# Patient Record
Sex: Female | Born: 1980
Health system: Southern US, Community
[De-identification: ages and names within clinical notes are randomized; demographics above are authoritative.]

## PROBLEM LIST (undated history)

## (undated) ENCOUNTER — Inpatient Hospital Stay (HOSPITAL_COMMUNITY): Payer: Self-pay

## (undated) ENCOUNTER — Inpatient Hospital Stay (HOSPITAL_COMMUNITY): Payer: 59

## (undated) DIAGNOSIS — Z87891 Personal history of nicotine dependence: Secondary | ICD-10-CM

## (undated) DIAGNOSIS — R011 Cardiac murmur, unspecified: Secondary | ICD-10-CM

## (undated) DIAGNOSIS — R519 Headache, unspecified: Secondary | ICD-10-CM

## (undated) DIAGNOSIS — O24419 Gestational diabetes mellitus in pregnancy, unspecified control: Secondary | ICD-10-CM

## (undated) DIAGNOSIS — E049 Nontoxic goiter, unspecified: Secondary | ICD-10-CM

## (undated) DIAGNOSIS — R51 Headache: Secondary | ICD-10-CM

## (undated) DIAGNOSIS — B999 Unspecified infectious disease: Secondary | ICD-10-CM

## (undated) DIAGNOSIS — Z8279 Family history of other congenital malformations, deformations and chromosomal abnormalities: Secondary | ICD-10-CM

## (undated) HISTORY — PX: CARDIAC SURGERY: SHX584

## (undated) HISTORY — DX: Family history of other congenital malformations, deformations and chromosomal abnormalities: Z82.79

## (undated) HISTORY — DX: Nontoxic goiter, unspecified: E04.9

## (undated) HISTORY — DX: Gestational diabetes mellitus in pregnancy, unspecified control: O24.419

---
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
## 2003-01-18 ENCOUNTER — Encounter: Admission: RE | Admit: 2003-01-18 | Discharge: 2003-01-18 | Payer: Self-pay | Admitting: Family Medicine

## 2003-06-24 ENCOUNTER — Encounter: Payer: Self-pay | Admitting: Emergency Medicine

## 2003-06-24 ENCOUNTER — Emergency Department (HOSPITAL_COMMUNITY): Admission: EM | Admit: 2003-06-24 | Discharge: 2003-06-24 | Payer: Self-pay | Admitting: Emergency Medicine

## 2004-12-18 ENCOUNTER — Emergency Department (HOSPITAL_COMMUNITY): Admission: EM | Admit: 2004-12-18 | Discharge: 2004-12-19 | Payer: Self-pay | Admitting: Emergency Medicine

## 2004-12-22 ENCOUNTER — Emergency Department (HOSPITAL_COMMUNITY): Admission: EM | Admit: 2004-12-22 | Discharge: 2004-12-22 | Payer: Self-pay | Admitting: Emergency Medicine

## 2005-03-30 ENCOUNTER — Emergency Department (HOSPITAL_COMMUNITY): Admission: EM | Admit: 2005-03-30 | Discharge: 2005-03-30 | Payer: Self-pay | Admitting: Emergency Medicine

## 2005-09-02 ENCOUNTER — Inpatient Hospital Stay (HOSPITAL_COMMUNITY): Admission: AD | Admit: 2005-09-02 | Discharge: 2005-09-02 | Payer: Self-pay | Admitting: Obstetrics & Gynecology

## 2006-03-12 ENCOUNTER — Emergency Department (HOSPITAL_COMMUNITY): Admission: EM | Admit: 2006-03-12 | Discharge: 2006-03-12 | Payer: Self-pay | Admitting: Emergency Medicine

## 2006-12-11 DIAGNOSIS — N946 Dysmenorrhea, unspecified: Secondary | ICD-10-CM | POA: Insufficient documentation

## 2008-06-03 ENCOUNTER — Emergency Department (HOSPITAL_COMMUNITY): Admission: EM | Admit: 2008-06-03 | Discharge: 2008-06-03 | Payer: Self-pay | Admitting: Emergency Medicine

## 2008-07-28 ENCOUNTER — Ambulatory Visit: Payer: Self-pay | Admitting: Gynecology

## 2012-01-27 ENCOUNTER — Encounter (HOSPITAL_BASED_OUTPATIENT_CLINIC_OR_DEPARTMENT_OTHER): Payer: Self-pay

## 2012-01-27 ENCOUNTER — Emergency Department (HOSPITAL_BASED_OUTPATIENT_CLINIC_OR_DEPARTMENT_OTHER)
Admission: EM | Admit: 2012-01-27 | Discharge: 2012-01-27 | Disposition: A | Discharge status: Home / Self Care | Payer: No Typology Code available for payment source | Attending: Emergency Medicine | Admitting: Emergency Medicine

## 2012-01-27 DIAGNOSIS — S43499A Other sprain of unspecified shoulder joint, initial encounter: Secondary | ICD-10-CM | POA: Insufficient documentation

## 2012-01-27 DIAGNOSIS — T148XXA Other injury of unspecified body region, initial encounter: Secondary | ICD-10-CM

## 2012-01-27 HISTORY — DX: Cardiac murmur, unspecified: R01.1

## 2012-01-27 MED ORDER — TRAMADOL HCL 50 MG PO TABS: 50.0000 mg | ORAL_TABLET | Freq: Four times a day (QID) | ORAL | Status: AC | PRN

## 2012-01-27 MED ORDER — METHOCARBAMOL 500 MG PO TABS: 500.0000 mg | ORAL_TABLET | Freq: Two times a day (BID) | ORAL | Status: AC

## 2012-01-27 MED ORDER — NAPROXEN 500 MG PO TABS: 500.0000 mg | ORAL_TABLET | Freq: Two times a day (BID) | ORAL | Status: AC
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
## 2012-01-27 NOTE — ED Notes (Signed)
PA-C at bedside 
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
## 2012-01-27 NOTE — ED Provider Notes (Signed)
Medical screening examination/treatment/procedure(s) were performed by non-physician practitioner and as supervising physician I was immediately available for consultation/collaboration.   Kielyn Kardell, MD 01/27/12 1200 
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
## 2012-04-20 ENCOUNTER — Emergency Department (HOSPITAL_BASED_OUTPATIENT_CLINIC_OR_DEPARTMENT_OTHER): Payer: 59

## 2012-04-20 ENCOUNTER — Encounter (HOSPITAL_BASED_OUTPATIENT_CLINIC_OR_DEPARTMENT_OTHER): Payer: Self-pay | Admitting: *Deleted

## 2012-04-20 DIAGNOSIS — J40 Bronchitis, not specified as acute or chronic: Secondary | ICD-10-CM | POA: Insufficient documentation

## 2012-04-20 NOTE — ED Notes (Signed)
Patient states she is having SOB since yesterday but worsening tonight. Also having pain in her chest when she coughs. Patient has a moderate nonproductive cough.

## 2012-04-21 ENCOUNTER — Emergency Department (HOSPITAL_BASED_OUTPATIENT_CLINIC_OR_DEPARTMENT_OTHER)
Admission: EM | Admit: 2012-04-21 | Discharge: 2012-04-21 | Disposition: A | Discharge status: Home / Self Care | Payer: 59 | Attending: Emergency Medicine | Admitting: Emergency Medicine

## 2012-04-21 DIAGNOSIS — J40 Bronchitis, not specified as acute or chronic: Secondary | ICD-10-CM

## 2012-04-21 LAB — CBC WITH DIFFERENTIAL/PLATELET
Basophils Absolute: 0 10*3/uL (ref 0.0–0.1)
Eosinophils Absolute: 0.2 10*3/uL (ref 0.0–0.7)
Lymphocytes Relative: 33 % (ref 12–46)
Lymphs Abs: 2.8 10*3/uL (ref 0.7–4.0)
MCH: 29.7 pg (ref 26.0–34.0)
Neutrophils Relative %: 54 % (ref 43–77)
Platelets: 317 10*3/uL (ref 150–400)
RBC: 4.28 MIL/uL (ref 3.87–5.11)
RDW: 13.7 % (ref 11.5–15.5)
WBC: 8.4 10*3/uL (ref 4.0–10.5)

## 2012-04-21 LAB — BASIC METABOLIC PANEL
GFR calc non Af Amer: >90 mL/min (ref >90)
Glucose, Bld: 100 mg/dL — ABNORMAL HIGH (ref 70–99)
Potassium: 3.5 mEq/L (ref 3.5–5.1)
Sodium: 136 mEq/L (ref 135–145)

## 2012-04-21 LAB — TROPONIN I: Troponin I: <0.3 ng/mL (ref <0.30)

## 2012-04-21 LAB — D-DIMER, QUANTITATIVE: D-Dimer, Quant: <0.22 ug/mL-FEU (ref 0.00–0.48)

## 2012-04-21 MED ORDER — BENZONATATE 100 MG PO CAPS: 100.0000 mg | ORAL_CAPSULE | Freq: Three times a day (TID) | ORAL | Status: AC

## 2012-04-21 MED ORDER — TRAMADOL HCL 50 MG PO TABS
50.0000 mg | ORAL_TABLET | Freq: Four times a day (QID) | ORAL | Status: AC | PRN
Start: 2012-04-21 — End: 2012-05-01

## 2012-04-21 MED ORDER — ALBUTEROL SULFATE HFA 108 (90 BASE) MCG/ACT IN AERS: 1.0000 | INHALATION_SPRAY | Freq: Four times a day (QID) | RESPIRATORY_TRACT | Status: DC | PRN

## 2012-04-21 NOTE — ED Provider Notes (Signed)
History  This chart was scribed for Doak Mah Smitty Cords, MD by Ladona Ridgel Day. This patient was seen in room MH03/MH03 and the patient's care was started at 2239  CSN: [id number]  Arrival date & time 04/20/12  2239   First MD Initiated Contact with Patient 04/21/12 0026      Chief Complaint  Patient presents with  . Shortness of Breath    Patient is a 31 y.o. female presenting with cough. The history is provided by the patient. No language interpreter was used.  Cough This is a new problem. The current episode started 2 days ago. The problem occurs constantly. The problem has been gradually worsening. The cough is non-productive. There has been no fever. Associated symptoms include shortness of breath. Pertinent negatives include no chest pain, no chills, no sweats, no headaches, no rhinorrhea, no sore throat and no myalgias. She has tried nothing for the symptoms. The treatment provided no relief. She is a smoker. Her past medical history is significant for asthma.    Cassandra Mann is a 31 y.o. female who presents to the Emergency Department complaining of constant, gradually worsening, non-productive cough since yesterday. She states her associated symptoms are SOB, nasal congestion and mild CP that is worse with coughing. She denies taking any medications for her symptoms. She denies having any sick contacts with similar symptoms. She has a h/o heart murmur. She is a current everyday smoker but denies alcohol use.  Past Medical History  Diagnosis Date  . Heart murmur     Past Surgical History  Procedure Date  . Cardiac surgery     No family history on file.  History  Substance Use Topics  . Smoking status: Current Everyday Smoker  . Smokeless tobacco: Never Used  . Alcohol Use: No    No OB history provided.  Review of Systems  Constitutional: Negative for chills and diaphoresis.  HENT: Positive for congestion. Negative for sore throat, rhinorrhea, sneezing, neck pain and  ear discharge.   Respiratory: Positive for cough and shortness of breath.   Cardiovascular: Negative for chest pain, palpitations and leg swelling.  Gastrointestinal: Negative for abdominal pain and anal bleeding.  Genitourinary: Negative for dysuria and difficulty urinating.  Musculoskeletal: Negative for myalgias.  Skin: Negative for color change and wound.  Neurological: Negative for facial asymmetry and headaches.  All other systems reviewed and are negative.    Allergies  Naproxen  Home Medications   Current Outpatient Rx  Name Route Sig Dispense Refill  . NAPROXEN 500 MG PO TABS Oral Take 1 tablet (500 mg total) by mouth 2 (two) times daily. 30 tablet 0    Triage Vitals: BP 115/75  Pulse 92  Temp 98.9 F (37.2 C) (Oral)  Resp 24  SpO2 100%  LMP 03/22/2012  Physical Exam  Nursing note and vitals reviewed. Constitutional: She is oriented to person, place, and time. She appears well-developed and well-nourished.  HENT:  Head: Normocephalic and atraumatic.       Cobblestone in back of throat  Eyes: Conjunctivae and EOM are normal. Pupils are equal, round, and reactive to light.  Neck: Normal range of motion. Neck supple. No thyromegaly present.  Cardiovascular: Normal rate, regular rhythm and normal heart sounds.  Exam reveals no gallop and no friction rub.   No murmur heard. Pulmonary/Chest: Effort normal and breath sounds normal. No respiratory distress. She has no wheezes. She has no rales.  Abdominal: Soft. Bowel sounds are normal. She exhibits no distension. There is  no tenderness. There is no rebound and no guarding.  Musculoskeletal: She exhibits no edema and no tenderness.  Lymphadenopathy:    She has no cervical adenopathy.  Neurological: She is alert and oriented to person, place, and time. She has normal reflexes. She displays normal reflexes.  Skin: Skin is warm and dry.  Psychiatric: She has a normal mood and affect. Her behavior is normal.    ED  Course  Procedures (including critical care time)  DIAGNOSTIC STUDIES: Oxygen Saturation is 100% on room air, normal by my interpretation.    COORDINATION OF CARE: At 1231 AM Discussed treatment plan with patient which includes CXR, blood work, heart markers and D-dimer. Patient agrees. Discussed smoking cessation and pt agrees.   Labs Reviewed  CBC WITH DIFFERENTIAL  BASIC METABOLIC PANEL  TROPONIN I  PREGNANCY, URINE  D-DIMER, QUANTITATIVE   Dg Chest 2 View  04/20/2012  *RADIOLOGY REPORT*  Clinical Data: Cough  CHEST - 2 VIEW  Comparison: 12/22/2004  Findings: Normal heart size.  Clear lungs.  IMPRESSION: Negative.  Original Report Authenticated By: Donavan Burnet, M.D.     No diagnosis found.  No ACS, this is clearly viral bronchitis.  In setting of > 8 hours of symptoms one negative EKG and troponin is sufficient to exclude ACS. No fever, no indication for antibiotics.  Return for fevers > 101, productive cough, chest pain or any concerns.  Follow up in 2 days with your family doctor for recheck.  Patient verbalizes understanding and agrees to follow up MDM   Date: 04/21/2012  Rate: 75  Rhythm: normal sinus rhythm  QRS Axis: normal  Intervals: normal  ST/T Wave abnormalities: normal  Conduction Disutrbances: none  Narrative Interpretation: unremarkable     I personally performed the services described in this documentation, which was scribed in my presence. The recorded information has been reviewed and considered.      Jasmine Awe, MD 04/21/12 [id number]

## 2012-04-21 NOTE — ED Notes (Signed)
Pt still unable to urinate 

## 2013-03-11 ENCOUNTER — Emergency Department (HOSPITAL_COMMUNITY): Payer: 59

## 2013-03-11 ENCOUNTER — Emergency Department (HOSPITAL_COMMUNITY)
Admission: EM | Admit: 2013-03-11 | Discharge: 2013-03-11 | Disposition: A | Discharge status: Home / Self Care | Payer: 59 | Attending: Emergency Medicine | Admitting: Emergency Medicine

## 2013-03-11 ENCOUNTER — Encounter (HOSPITAL_COMMUNITY): Payer: Self-pay | Admitting: Emergency Medicine

## 2013-03-11 DIAGNOSIS — J029 Acute pharyngitis, unspecified: Secondary | ICD-10-CM | POA: Insufficient documentation

## 2013-03-11 DIAGNOSIS — J069 Acute upper respiratory infection, unspecified: Secondary | ICD-10-CM | POA: Insufficient documentation

## 2013-03-11 DIAGNOSIS — R071 Chest pain on breathing: Secondary | ICD-10-CM | POA: Insufficient documentation

## 2013-03-11 DIAGNOSIS — Z9889 Other specified postprocedural states: Secondary | ICD-10-CM | POA: Insufficient documentation

## 2013-03-11 DIAGNOSIS — J3489 Other specified disorders of nose and nasal sinuses: Secondary | ICD-10-CM | POA: Insufficient documentation

## 2013-03-11 DIAGNOSIS — R011 Cardiac murmur, unspecified: Secondary | ICD-10-CM | POA: Insufficient documentation

## 2013-03-11 DIAGNOSIS — F17200 Nicotine dependence, unspecified, uncomplicated: Secondary | ICD-10-CM | POA: Insufficient documentation

## 2013-03-11 DIAGNOSIS — Z79899 Other long term (current) drug therapy: Secondary | ICD-10-CM | POA: Insufficient documentation

## 2013-03-11 DIAGNOSIS — R52 Pain, unspecified: Secondary | ICD-10-CM | POA: Insufficient documentation

## 2013-03-11 MED ORDER — HYDROCODONE-ACETAMINOPHEN 5-325 MG PO TABS: 1.0000 | ORAL_TABLET | Freq: Four times a day (QID) | ORAL | Status: DC | PRN

## 2013-03-11 MED ORDER — BENZONATATE 100 MG PO CAPS: 100.0000 mg | ORAL_CAPSULE | Freq: Three times a day (TID) | ORAL | Status: DC

## 2013-03-11 MED ORDER — HYDROCODONE-ACETAMINOPHEN 5-325 MG PO TABS
1.0000 | ORAL_TABLET | Freq: Once | ORAL | Status: AC
  Administered 2013-03-11: 1 via ORAL
  Filled 2013-03-11: qty 1

## 2013-03-11 NOTE — ED Provider Notes (Signed)
History    CSN: [id number] Arrival date & time 03/11/13  4098 First MD Initiated Contact with Patient 03/11/13 0935     Chief Complaint  Patient presents with  . flu like symptoms    HPI Patient presents to the emergency room because of trouble with cough, congestion and chest pain. Symptoms started yesterday it with nasal congestion headache cough and general body aches. Her symptoms have progressed and today she is having burning pain in her chest. It increases with palpation and breathing. She continues to have nasal congestion has somewhat of a sore throat. She has not noticed any swelling. She denies any abdominal pain. He has not had any shortness of breath. Patient denies any recent trips or travel. She has not been exposed to anyone who has been overseas. Past Medical History  Diagnosis Date  . Heart murmur   Pt does not require antibiotics when having procedures done  Past Surgical History  Procedure Laterality Date  . Cardiac surgery      No family history on file.  History  Substance Use Topics  . Smoking status: Current Every Day Smoker  . Smokeless tobacco: Never Used  . Alcohol Use: No    OB History   Grav Para Term Preterm Abortions TAB SAB Ect Mult Living                  Review of Systems  All other systems reviewed and are negative.    Allergies  Naproxen and Tramadol  Home Medications   Current Outpatient Rx  Name  Route  Sig  Dispense  Refill  . Chlorphen-Pseudoephed-APAP (THERAFLU FLU/COLD PO)   Oral   Take 1 packet by mouth.         . diphenhydrAMINE (BENADRYL) 25 MG tablet   Oral   Take 25 mg by mouth every 6 (six) hours as needed for itching.         . pseudoephedrine (SUDAFED) 30 MG tablet   Oral   Take 30 mg by mouth every 4 (four) hours as needed for congestion.         Marland Kitchen albuterol (PROVENTIL HFA;VENTOLIN HFA) 108 (90 BASE) MCG/ACT inhaler   Inhalation   Inhale 1-2 puffs into the lungs every 6 (six) hours as needed for  wheezing.   1 Inhaler   0   . benzonatate (TESSALON) 100 MG capsule   Oral   Take 1 capsule (100 mg total) by mouth every 8 (eight) hours.   21 capsule   0   . HYDROcodone-acetaminophen (NORCO) 5-325 MG per tablet   Oral   Take 1 tablet by mouth every 6 (six) hours as needed for pain.   16 tablet   0     BP 111/66  Pulse 81  Temp(Src) 98.3 F (36.8 C) (Oral)  Resp 20  SpO2 99%  LMP 02/25/2013  Physical Exam  Nursing note and vitals reviewed. Constitutional: She appears well-developed and well-nourished.  HENT:  Head: Normocephalic and atraumatic.  Right Ear: External ear normal.  Left Ear: External ear normal.  Mouth/Throat: No oropharyngeal exudate.  Eyes: Conjunctivae are normal. Right eye exhibits no discharge. Left eye exhibits no discharge. No scleral icterus.  Neck: Neck supple. No tracheal deviation present.  Cardiovascular: Normal rate, regular rhythm and intact distal pulses.   Pulmonary/Chest: Effort normal and breath sounds normal. No stridor. No respiratory distress. She has no wheezes. She has no rales. She exhibits tenderness.  Well healed thoracotomy scar left chest wall  Abdominal: Soft. Bowel sounds are normal. She exhibits no distension. There is no tenderness. There is no rebound and no guarding.  Musculoskeletal: She exhibits no edema and no tenderness.  Neurological: She is alert. She has normal strength. No sensory deficit. Cranial nerve deficit:  no gross defecits noted. She exhibits normal muscle tone. She displays no seizure activity. Coordination normal.  Skin: Skin is warm and dry. No rash noted. She is not diaphoretic.  Psychiatric: She has a normal mood and affect.    ED Course  Procedures (including critical care time)  Rate: 75  Rhythm: normal sinus rhythm  QRS Axis: normal  Intervals: normal  ST/T Wave abnormalities: normal  Conduction Disutrbances:none  Narrative Interpretation:   Old EKG Reviewed: none available  Labs Reviewed  - No data to display Dg Chest 2 View  03/11/2013   *RADIOLOGY REPORT*  Clinical Data: Cough, chest pain, shortness of breath, former smoker  CHEST - 2 VIEW  Comparison: 04/20/2012; 12/22/2004  Findings: Grossly unchanged cardiac silhouette and mediastinal contours.  A surgical clip is seen within the mediastinum.  No focal airspace opacity.  No pleural effusion or pneumothorax.  No evidence of edema.  Unchanged bones.  IMPRESSION: No acute cardiopulmonary disease.   Original Report Authenticated By: Tacey Ruiz, MD     1. URI (upper respiratory infection)   2. Costochondral chest pain       MDM  Pt's symptoms are suggestive of URI.  No pna on cxr.  EKG normal.  Doubt pericarditis, PE.  May be related to costochondritis.  Dc home with oral medications.        Celene Kras, MD 03/11/13 [phone]

## 2013-03-11 NOTE — ED Notes (Signed)
Per pt, congestion started yesterday, thought it was allergies, head congestion, cough, body aches

## 2013-03-18 ENCOUNTER — Ambulatory Visit: Payer: Self-pay

## 2013-03-24 ENCOUNTER — Ambulatory Visit (INDEPENDENT_AMBULATORY_CARE_PROVIDER_SITE_OTHER): Payer: 59 | Admitting: Family Medicine

## 2013-03-24 ENCOUNTER — Encounter: Payer: Self-pay | Admitting: Family Medicine

## 2013-03-24 VITALS — BP 94/65 | HR 101 | Temp 98.0°F | Ht 65.0 in | Wt 193.0 lb

## 2013-03-24 DIAGNOSIS — G43909 Migraine, unspecified, not intractable, without status migrainosus: Secondary | ICD-10-CM | POA: Insufficient documentation

## 2013-03-24 DIAGNOSIS — R05 Cough: Secondary | ICD-10-CM | POA: Insufficient documentation

## 2013-03-24 MED ORDER — ACETAMINOPHEN-CODEINE #3 300-30 MG PO TABS: 1.0000 | ORAL_TABLET | ORAL | Status: DC | PRN

## 2013-03-24 MED ORDER — KETOROLAC TROMETHAMINE 10 MG PO TABS: 10.0000 mg | ORAL_TABLET | Freq: Four times a day (QID) | ORAL | Status: DC | PRN

## 2013-03-24 MED ORDER — PROMETHAZINE HCL 12.5 MG PO TABS: 12.5000 mg | ORAL_TABLET | Freq: Three times a day (TID) | ORAL | Status: DC | PRN

## 2013-03-24 NOTE — Assessment & Plan Note (Signed)
For the past 2 weeks; she has subjective fever and worse sore throat initially but now she just has cough -Recommend try prednisone burst given previously for cough for bronchitis -Consider treating for atypical pneumonia if not improved with prednisone -Tylenol with codeine at nighttime -Follow-up in 1 week; may cancel appointment if improved

## 2013-03-24 NOTE — Patient Instructions (Addendum)
Take the course of prednisone  If the cough is not improved after prednisone course, call and let me know  If your symptoms worsen, please come back and see [place]  Try Tylenol with codeine for cough nighttime

## 2013-03-24 NOTE — Progress Notes (Signed)
  Subjective:    Patient ID: Cassandra Mann, female    DOB: November 29, 1980, 32 y.o.   MRN: [id number]  HPI # SDA: coughing and migraines She has been coughing daily since May 28  Seen @ [facility], diagnosed with bronchitis/URI Given Tessalon; did not help So she went to [facility]; given breathing treatment; given Rx Prednisone and Zyrtec; she had not taken prednisone    Migraines may be exacerbated by cough  Cough is not necessarily worse at nighttime   Review of Systems Denies fevers, ear pain, chills Endorses body aches/chest aches, mild sore throat      Objective:   Physical Exam GEN: NAD; frequent episodes dry coughing HEENT:   Head: [place]/AT   Eyes: normal conjunctiva without injection or tearing   Ears: TM clear bilaterally with good light reflex and without erythema or air-fluid level   Nose: no rhinorrhea, normal turbinates   Mouth: MMM; no tonsillar adenopathy; no oropharyngeal erythema NECK: tenderness palpation left submandible PULM: NI WOB; CTAB without w/r/r CV: RRR    Assessment & Plan:

## 2013-03-31 ENCOUNTER — Ambulatory Visit: Payer: 59 | Admitting: Family Medicine

## 2013-06-30 ENCOUNTER — Inpatient Hospital Stay (HOSPITAL_COMMUNITY): Payer: 59

## 2013-06-30 ENCOUNTER — Inpatient Hospital Stay (HOSPITAL_COMMUNITY)
Admission: AD | Admit: 2013-06-30 | Discharge: 2013-06-30 | Disposition: A | Discharge status: Home / Self Care | Payer: 59 | Source: Ambulatory Visit | Attending: Obstetrics & Gynecology | Admitting: Obstetrics & Gynecology

## 2013-06-30 ENCOUNTER — Encounter (HOSPITAL_COMMUNITY): Payer: Self-pay | Admitting: Family

## 2013-06-30 DIAGNOSIS — O99891 Other specified diseases and conditions complicating pregnancy: Secondary | ICD-10-CM | POA: Insufficient documentation

## 2013-06-30 DIAGNOSIS — R109 Unspecified abdominal pain: Secondary | ICD-10-CM

## 2013-06-30 DIAGNOSIS — O26899 Other specified pregnancy related conditions, unspecified trimester: Secondary | ICD-10-CM

## 2013-06-30 DIAGNOSIS — O9989 Other specified diseases and conditions complicating pregnancy, childbirth and the puerperium: Secondary | ICD-10-CM

## 2013-06-30 LAB — CBC
MCH: 29.2 pg (ref 26.0–34.0)
MCHC: 34.6 g/dL (ref 30.0–36.0)
MCV: 84.4 fL (ref 78.0–100.0)
Platelets: 294 10*3/uL (ref 150–400)
RDW: 14.5 % (ref 11.5–15.5)

## 2013-06-30 LAB — URINALYSIS, ROUTINE W REFLEX MICROSCOPIC
Ketones, ur: 15 mg/dL — AB
Leukocytes, UA: NEGATIVE
Nitrite: NEGATIVE
Protein, ur: NEGATIVE mg/dL
Urobilinogen, UA: 0.2 mg/dL (ref 0.0–1.0)
pH: 7.5 (ref 5.0–8.0)

## 2013-06-30 LAB — ABO/RH: ABO/RH(D): O POS

## 2013-06-30 MED ORDER — HYDROMORPHONE HCL PF 1 MG/ML IJ SOLN
1.0000 mg | Freq: Once | INTRAMUSCULAR | Status: AC
  Administered 2013-06-30: 1 mg via INTRAMUSCULAR
  Filled 2013-06-30: qty 1

## 2013-06-30 MED ORDER — OXYCODONE-ACETAMINOPHEN 5-325 MG PO TABS: 1.0000 | ORAL_TABLET | ORAL | Status: DC | PRN

## 2013-06-30 NOTE — MAU Provider Note (Signed)
Attestation of Attending Supervision of Advanced Practitioner (PA/CNM/NP): Evaluation and management procedures were performed by the Advanced Practitioner under my supervision and collaboration.  I have reviewed the Advanced Practitioner's note and chart, and I agree with the management and plan.  Tyrah Broers, MD, FACOG Attending Obstetrician & Gynecologist Faculty Practice, [facility]  

## 2013-06-30 NOTE — MAU Provider Note (Signed)
History     CSN: [id number]  Arrival date and time: 06/30/13 1107   First Provider Initiated Contact with Patient 06/30/13 1212      Chief Complaint  Patient presents with  . Possible Pregnancy  . Abdominal Cramping   HPI [name] is a 32 y.o. G2P0010 at [redacted]w[redacted]d who presents to MAU today with complaint of lower abdominal pain since last night. She states LMP of 05/13/13. She states pain is constant, rated at 7/10 now. She took motrin yesterday without relief. She denies vaginal bleeding, discharge, UTI symptoms or fever. She has had nausea and diarrhea without vomiting or constipation.   OB History   Grav Para Term Preterm Abortions TAB SAB Ect Mult Living   2    1 1           Past Medical History  Diagnosis Date  . Heart murmur     Past Surgical History  Procedure Laterality Date  . Cardiac surgery      When she was 36 or 32 years old    History reviewed. No pertinent family history.  History  Substance Use Topics  . Smoking status: Current Some Day Smoker -- 0.25 packs/day  . Smokeless tobacco: Never Used  . Alcohol Use: No    Allergies:  Allergies  Allergen Reactions  . Naproxen Palpitations  . Tramadol Palpitations    Also gives her migraines     No prescriptions prior to admission    Review of Systems  Constitutional: Negative for fever and malaise/fatigue.  Gastrointestinal: Positive for nausea, abdominal pain and diarrhea. Negative for vomiting and constipation.  Genitourinary: Negative for dysuria, urgency and frequency.       Neg - vaginal bleeding, discharge  Neurological: Negative for dizziness.   Physical Exam   Blood pressure 98/42, pulse 54, temperature 98.4 F (36.9 C), temperature source Oral, resp. rate 16, height 5\' 5"  (1.651 m), weight 186 lb 9.6 oz (84.641 kg), last menstrual period 05/13/2013, SpO2 100.00%.  Physical Exam  Constitutional: She is oriented to person, place, and time. She appears well-developed and  well-nourished. No distress.  HENT:  Head: Normocephalic and atraumatic.  Cardiovascular: Normal rate, regular rhythm and normal heart sounds.   Respiratory: Effort normal and breath sounds normal. No respiratory distress.  GI: Soft. Bowel sounds are normal. She exhibits no distension and no mass. There is tenderness (moderate tenderness to palpation of the lower abdomen). There is no rebound and no guarding.  Genitourinary: Uterus is tender. Uterus is not enlarged. Cervix exhibits no motion tenderness, no discharge and no friability. Right adnexum displays tenderness. Right adnexum displays no mass. Left adnexum displays tenderness. Left adnexum displays no mass. No bleeding around the vagina. Vaginal discharge (scant white discharge noted) found.  Musculoskeletal: Normal range of motion.  Neurological: She is alert and oriented to person, place, and time.  Skin: Skin is warm and dry. No erythema.  Psychiatric: She has a normal mood and affect.   Results for orders placed during the hospital encounter of 06/30/13 (from the past 24 hour(s))  URINALYSIS, ROUTINE W REFLEX MICROSCOPIC     Status: Abnormal   Collection Time    06/30/13 11:40 AM      Result Value Range   Color, Urine YELLOW  YELLOW   APPearance CLEAR  CLEAR   Specific Gravity, Urine 1.015  1.005 - 1.030   pH 7.5  5.0 - 8.0   Glucose, UA NEGATIVE  NEGATIVE mg/dL   Hgb urine dipstick  NEGATIVE  NEGATIVE   Bilirubin Urine NEGATIVE  NEGATIVE   Ketones, ur 15 (*) NEGATIVE mg/dL   Protein, ur NEGATIVE  NEGATIVE mg/dL   Urobilinogen, UA 0.2  0.0 - 1.0 mg/dL   Nitrite NEGATIVE  NEGATIVE   Leukocytes, UA NEGATIVE  NEGATIVE  POCT PREGNANCY, URINE     Status: Abnormal   Collection Time    06/30/13 11:41 AM      Result Value Range   Preg Test, Ur POSITIVE (*) NEGATIVE  CBC     Status: None   Collection Time    06/30/13 12:30 PM      Result Value Range   WBC 5.2  4.0 - 10.5 K/uL   RBC 4.42  3.87 - 5.11 MIL/uL   Hemoglobin 12.9   12.0 - 15.0 g/dL   HCT 95.6  21.3 - 08.6 %   MCV 84.4  78.0 - 100.0 fL   MCH 29.2  26.0 - 34.0 pg   MCHC 34.6  30.0 - 36.0 g/dL   RDW 57.8  46.9 - 62.9 %   Platelets 294  150 - 400 K/uL  ABO/RH     Status: None   Collection Time    06/30/13 12:30 PM      Result Value Range   ABO/RH(D) O POS    HCG, QUANTITATIVE, PREGNANCY     Status: Abnormal   Collection Time    06/30/13 12:30 PM      Result Value Range   hCG, Beta Chain, Quant, S 596 (*) <5 mIU/mL  WET PREP, GENITAL     Status: Abnormal   Collection Time    06/30/13  1:59 PM      Result Value Range   Yeast Wet Prep HPF POC NONE SEEN  NONE SEEN   Trich, Wet Prep NONE SEEN  NONE SEEN   Clue Cells Wet Prep HPF POC NONE SEEN  NONE SEEN   WBC, Wet Prep HPF POC FEW (*) NONE SEEN   US Ob Comp Less 14 Wks  06/30/2013   *RADIOLOGY REPORT*  Clinical Data: Pelvic pain.  Positive pregnancy test.  OBSTETRIC <14 WK [place] AND TRANSVAGINAL OB US  Technique:  Both transabdominal and transvaginal ultrasound examinations were performed for complete evaluation of the gestation as well as the maternal uterus, adnexal regions, and pelvic cul-de-sac.  Transvaginal technique was performed to assess early pregnancy.  Comparison:  None.  Intrauterine gestational sac:  Visualized/normal in shape. Yolk sac: Not visualized Embryo: Not visualized Cardiac Activity: Not visualized  MSD: 4 mm  4 w 6 d      [place] EDC: 03/03/2014  Maternal uterus/adnexae: Probable right ovarian corpus luteum.  Left ovary is normal.  Small free fluid noted at the fundus.  IMPRESSION: Single intrauterine gestational sac without yolk sac, fetal pole, or cardiac activity yet visualized.  Follow-up is recommended in 10- 14 days to document appropriate pregnancy progression.  The patient was in significant pain at the time of the examination which raises the question of a possible non visualized heterotopic pregnancy, although this would be much less likely statistically.   Original Report  Authenticated By: Christiana Pellant, M.D.   US Ob Transvaginal  06/30/2013   *RADIOLOGY REPORT*  Clinical Data: Pelvic pain.  Positive pregnancy test.  OBSTETRIC <14 WK [place] AND TRANSVAGINAL OB US  Technique:  Both transabdominal and transvaginal ultrasound examinations were performed for complete evaluation of the gestation as well as the maternal uterus, adnexal regions, and pelvic cul-de-sac.  Transvaginal  technique was performed to assess early pregnancy.  Comparison:  None.  Intrauterine gestational sac:  Visualized/normal in shape. Yolk sac: Not visualized Embryo: Not visualized Cardiac Activity: Not visualized  MSD: 4 mm  4 w 6 d      [place] EDC: 03/03/2014  Maternal uterus/adnexae: Probable right ovarian corpus luteum.  Left ovary is normal.  Small free fluid noted at the fundus.  IMPRESSION: Single intrauterine gestational sac without yolk sac, fetal pole, or cardiac activity yet visualized.  Follow-up is recommended in 10- 14 days to document appropriate pregnancy progression.  The patient was in significant pain at the time of the examination which raises the question of a possible non visualized heterotopic pregnancy, although this would be much less likely statistically.   Original Report Authenticated By: Christiana Pellant, M.D.    MAU Course  Procedures None  MDM +UPT UA, Wet prep, GC/Chlmaydia, CBC, ABO/Rh, quant hCG and [place] today IM Dilaudid. Possible interaction with Dilaudid because of low severity interaction with Tramadol. Discussed with PharmD. Because of low severity and monitoring in MAU ok to give Dilaudid.  Patient reports minimal improvement in pain and did not tolerate exam or [place] well.  Patient given additional 1 mg Dilaudid IM - patient reports improvement in pain Assessment and Plan  A: Adbominal pain in pregnancy  P: Discharge home Rx for Percocet given to patient Patient advised to return to MAU in 48 hours for repeat quant hCG Patient may return to MAU sooner if symptoms  persist or worsen   Freddi Starr, PA-C  06/30/2013, 3:27 PM

## 2013-06-30 NOTE — MAU Note (Signed)
Patient states she has had a positive home pregnancy test. Started having abdominal cramping last night with nausea and diarrhea. Denies bleeding or discharge.

## 2013-06-30 NOTE — MAU Note (Signed)
32 yo, G2P0 presenting to MAU with c/o lower abdominal cramping since 1900 yesterday. Reports suddent onset, constant, no relieving factors; reports last dose ibuprofen at 2200 yesterday.  Denies VB or changes in discharge. Denies diet changes; reports normal BM this morning. Reports nausea, no vomiting x 1 week.

## 2013-07-01 ENCOUNTER — Inpatient Hospital Stay (HOSPITAL_COMMUNITY)
Admission: AD | Admit: 2013-07-01 | Discharge: 2013-07-01 | Disposition: A | Discharge status: Home / Self Care | Payer: 59 | Source: Ambulatory Visit | Attending: Obstetrics & Gynecology | Admitting: Obstetrics & Gynecology

## 2013-07-01 ENCOUNTER — Encounter (HOSPITAL_COMMUNITY): Payer: Self-pay | Admitting: *Deleted

## 2013-07-01 ENCOUNTER — Inpatient Hospital Stay (HOSPITAL_COMMUNITY): Payer: 59

## 2013-07-01 DIAGNOSIS — O209 Hemorrhage in early pregnancy, unspecified: Secondary | ICD-10-CM

## 2013-07-01 DIAGNOSIS — O4690 Antepartum hemorrhage, unspecified, unspecified trimester: Secondary | ICD-10-CM

## 2013-07-01 DIAGNOSIS — O26899 Other specified pregnancy related conditions, unspecified trimester: Secondary | ICD-10-CM

## 2013-07-01 DIAGNOSIS — R109 Unspecified abdominal pain: Secondary | ICD-10-CM | POA: Insufficient documentation

## 2013-07-01 LAB — GC/CHLAMYDIA PROBE AMP
CT Probe RNA: NEGATIVE
GC Probe RNA: NEGATIVE

## 2013-07-01 LAB — HCG, QUANTITATIVE, PREGNANCY: hCG, Beta Chain, Quant, S: 659 m[IU]/mL — ABNORMAL HIGH (ref <5)

## 2013-07-01 LAB — CBC
Platelets: 326 10*3/uL (ref 150–400)
RBC: 4.72 MIL/uL (ref 3.87–5.11)
WBC: 7 10*3/uL (ref 4.0–10.5)

## 2013-07-01 MED ORDER — HYDROMORPHONE HCL PF 1 MG/ML IJ SOLN
1.0000 mg | Freq: Once | INTRAMUSCULAR | Status: AC
  Administered 2013-07-01: 1 mg via INTRAMUSCULAR

## 2013-07-01 MED ORDER — HYDROMORPHONE HCL PF 1 MG/ML IJ SOLN
1.0000 mg | Freq: Once | INTRAMUSCULAR | Status: AC
  Administered 2013-07-01: 1 mg via INTRAMUSCULAR
  Filled 2013-07-01: qty 1

## 2013-07-01 MED ORDER — HYDROMORPHONE HCL PF 1 MG/ML IJ SOLN
INTRAMUSCULAR | Status: AC
  Administered 2013-07-01: 1 mg via INTRAMUSCULAR
  Filled 2013-07-01: qty 1

## 2013-07-01 NOTE — MAU Provider Note (Signed)
History     CSN: [id number]  Arrival date and time: 07/01/13 1339   First Provider Initiated Contact with Patient 07/01/13 1409      Chief Complaint  Patient presents with  . Vaginal Bleeding   HPI [name] is a 32 y.o. G2P0010 at [redacted]w[redacted]d who presents to MAU today with complaint of vaginal bleeding and lower abdominal pain x 1 hour. The patient was seen yesterday for lower abdominal pain and had IUGS at [redacted]w[redacted]d without YS/FP or adnexal masses. Patient was not bleeding at that time. She denies recent intercourse, N/V or fever. She endorses dizziness and weakness. She rates her pain now at 9/10 and has not taken any of the percocet previously prescribed.    OB History   Grav Para Term Preterm Abortions TAB SAB Ect Mult Living   2    1 1           Past Medical History  Diagnosis Date  . Heart murmur     Past Surgical History  Procedure Laterality Date  . Cardiac surgery      When she was 81 or 32 years old    History reviewed. No pertinent family history.  History  Substance Use Topics  . Smoking status: Current Some Day Smoker -- 0.25 packs/day  . Smokeless tobacco: Never Used  . Alcohol Use: No    Allergies:  Allergies  Allergen Reactions  . Naproxen Palpitations  . Tramadol Palpitations    Also gives her migraines     No prescriptions prior to admission    Review of Systems  Constitutional: Negative for fever and malaise/fatigue.  Gastrointestinal: Positive for abdominal pain. Negative for nausea, vomiting, diarrhea and constipation.  Genitourinary: Negative for dysuria, urgency and frequency.       Neg - vaginal discharge + vaginal bleeding  Musculoskeletal: Positive for back pain.  Neurological: Positive for dizziness and weakness. Negative for loss of consciousness.   Physical Exam   Blood pressure 93/50, pulse 70, temperature 98.9 F (37.2 C), temperature source Oral, resp. rate 18, last menstrual period 05/13/2013.  Physical Exam   Constitutional: She is oriented to person, place, and time. She appears well-developed and well-nourished. No distress.  HENT:  Head: Normocephalic and atraumatic.  Cardiovascular: Normal rate and normal heart sounds.   Respiratory: Effort normal and breath sounds normal. No respiratory distress.  GI: Soft. She exhibits no distension and no mass. There is tenderness (moderate tenderness to palpation of the lower abdomen bilaterally). There is no rebound and no guarding.  Genitourinary: Uterus is enlarged (exam limited by maternal body habitus) and tender. Cervix exhibits no motion tenderness, no discharge and no friability. Right adnexum displays tenderness. Right adnexum displays no mass. Left adnexum displays tenderness. Left adnexum displays no mass. There is bleeding (small amount of active bleeding in the vagina) around the vagina.  Neurological: She is alert and oriented to person, place, and time.  Skin: Skin is warm and dry. No erythema.  Psychiatric: She has a normal mood and affect.   Results for orders placed during the hospital encounter of 07/01/13 (from the past 24 hour(s))  CBC     Status: None   Collection Time    07/01/13  2:36 PM      Result Value Range   WBC 7.0  4.0 - 10.5 K/uL   RBC 4.72  3.87 - 5.11 MIL/uL   Hemoglobin 13.9  12.0 - 15.0 g/dL   HCT 40.9  81.1 - 91.4 %  MCV 83.9  78.0 - 100.0 fL   MCH 29.4  26.0 - 34.0 pg   MCHC 35.1  30.0 - 36.0 g/dL   RDW 16.1  09.6 - 04.5 %   Platelets 326  150 - 400 K/uL  HCG, QUANTITATIVE, PREGNANCY     Status: Abnormal   Collection Time    07/01/13  2:36 PM      Result Value Range   hCG, Beta Chain, Quant, S 659 (*) <5 mIU/mL   US Ob Comp Less 14 Wks  06/30/2013   *RADIOLOGY REPORT*  Clinical Data: Pelvic pain.  Positive pregnancy test.  OBSTETRIC <14 WK [place] AND TRANSVAGINAL OB US  Technique:  Both transabdominal and transvaginal ultrasound examinations were performed for complete evaluation of the gestation as well as the  maternal uterus, adnexal regions, and pelvic cul-de-sac.  Transvaginal technique was performed to assess early pregnancy.  Comparison:  None.  Intrauterine gestational sac:  Visualized/normal in shape. Yolk sac: Not visualized Embryo: Not visualized Cardiac Activity: Not visualized  MSD: 4 mm  4 w 6 d      [place] EDC: 03/03/2014  Maternal uterus/adnexae: Probable right ovarian corpus luteum.  Left ovary is normal.  Small free fluid noted at the fundus.  IMPRESSION: Single intrauterine gestational sac without yolk sac, fetal pole, or cardiac activity yet visualized.  Follow-up is recommended in 10- 14 days to document appropriate pregnancy progression.  The patient was in significant pain at the time of the examination which raises the question of a possible non visualized heterotopic pregnancy, although this would be much less likely statistically.   Original Report Authenticated By: Christiana Pellant, M.D.   US Ob Transvaginal  07/01/2013   *RADIOLOGY REPORT*  Clinical Data: Pelvic pain.  Follow-up intrauterine gestational sac.  TRANSVAGINAL OBSTETRIC US  Technique:  Transvaginal ultrasound was performed for complete evaluation of the gestation as well as the maternal uterus, adnexal regions, and pelvic cul-de-sac.  Comparison:  06/30/2013  Intrauterine gestational sac: Visualized/normal in shape. Yolk sac: Not visualized Embryo: Not visualized Cardiac Activity: Not visualized  MSD: 6  mm  5    w 0    d           [place] EDC: 03/03/14  Maternal uterus/adnexae: The ovaries are normal.  A small amount of anechoic free fluid is noted at the right uterine fundus/adnexa.  Normal-appearing bowel is incidentally noted in the right adnexa.  IMPRESSION: Interval slight growth of the intrauterine gestational sac as expected given the extremely short time interval since yesterday's exam.  No yolk sac or fetal pole are yet visualized but would not necessarily be expected at this early gestational age.  An area questioned by the  sonographer initially in the right adnexa was proven to represent bowel on subsequent imaging by a different sonographer.  No adnexal mass is identified.  Given the presence of an IUP, a non visualized heterotopic pregnancy is statistically very unlikely, but theoretically possible.  If clinically appropriate, ultrasound followup could be performed in 10-14 days unless the patient's clinical condition mandates treatment earlier.   Original Report Authenticated By: Christiana Pellant, M.D.   US Ob Transvaginal  06/30/2013   *RADIOLOGY REPORT*  Clinical Data: Pelvic pain.  Positive pregnancy test.  OBSTETRIC <14 WK [place] AND TRANSVAGINAL OB US  Technique:  Both transabdominal and transvaginal ultrasound examinations were performed for complete evaluation of the gestation as well as the maternal uterus, adnexal regions, and pelvic cul-de-sac.  Transvaginal technique was performed to  assess early pregnancy.  Comparison:  None.  Intrauterine gestational sac:  Visualized/normal in shape. Yolk sac: Not visualized Embryo: Not visualized Cardiac Activity: Not visualized  MSD: 4 mm  4 w 6 d      [place] EDC: 03/03/2014  Maternal uterus/adnexae: Probable right ovarian corpus luteum.  Left ovary is normal.  Small free fluid noted at the fundus.  IMPRESSION: Single intrauterine gestational sac without yolk sac, fetal pole, or cardiac activity yet visualized.  Follow-up is recommended in 10- 14 days to document appropriate pregnancy progression.  The patient was in significant pain at the time of the examination which raises the question of a possible non visualized heterotopic pregnancy, although this would be much less likely statistically.   Original Report Authenticated By: Christiana Pellant, M.D.     MAU Course  Procedures None  MDM IM Dilaudid given - patient reports minimal improvement in pain Additional 1 mg Dilaudid IM given - patient reports some improvement in pain Discussed [place] results with Dr. Marice Potter. Patient to  follow-up in 48 hours for quant hCG. Review ectopic/bleeding precautions.   Assessment and Plan  A: Vaginal bleeding in pregnancy, first trimester Abdominal pain in pregnancy, antepartum  P: Discharge home Patient has Rx for Percocet from yesterday and encouraged to use PRN for pain Patient advised to return in 48 hours for follow-up labs Ectopic/Bleeding precautions reviewed Patient may return to MAU sooner if symptoms persist or worsen  Freddi Starr, PA-C  07/01/2013, 9:30 PM

## 2013-07-01 NOTE — Discharge Instructions (Signed)
Vaginal Bleeding During Pregnancy  A small amount of bleeding from the vagina can happen anytime during pregnancy. It usually stops on its own. However, some bleeding can be serious. Be sure to tell your doctor about all vaginal bleeding.  HOME CARE   Get plenty of rest and sleep.   Stay in bed and only get up to go to the bathroom as told by your doctor.   Write down the number of pads you use each day. Note how soaked they are.   Do not use tampons. Do not clean the vagina with a stream of water (douche).   Do not have sex (intercourse) or put anything into your vagina. Have this approved by your doctor.   Save any tissue that comes from your vagina. Show it to your doctor.   Only take medicine as told by your doctor.   Follow your doctor's advice about lifting, driving, and physical activity.  GET HELP RIGHT AWAY IF:    You feel your baby move less or not at all.   You pass out (faint) while going to the bathroom.   You have more bleeding.   You start to have contractions.   You have severe cramps in your stomach, back, or belly (abdomen).   You are leaking fluid or have a gush of fluid from your vagina.   You become lightheaded or weak.   You have chills.   You have clumps of tissue or blood clots coming from your vagina.   You have a fever.  MAKE SURE YOU:    Understand these instructions.   Will watch your condition.   Will get help right away if you are not doing well or get worse.  Document Released: 07/09/2008 Document Revised: 09/16/2012 Document Reviewed: 07/20/2012  [employer] Patient Information 2014 [employer]

## 2013-07-01 NOTE — MAU Note (Signed)
Pt reports she was here yesterday for pregnancy eval. Told to come back tomorrow for repeat lab work. Started having increased vaginal bleeding and cramping.

## 2013-07-03 ENCOUNTER — Encounter (HOSPITAL_COMMUNITY): Payer: Self-pay | Admitting: *Deleted

## 2013-07-03 ENCOUNTER — Inpatient Hospital Stay (HOSPITAL_COMMUNITY)
Admission: AD | Admit: 2013-07-03 | Discharge: 2013-07-03 | Disposition: A | Discharge status: Home / Self Care | Payer: 59 | Source: Ambulatory Visit | Attending: Obstetrics & Gynecology | Admitting: Obstetrics & Gynecology

## 2013-07-03 ENCOUNTER — Inpatient Hospital Stay (HOSPITAL_COMMUNITY): Payer: 59

## 2013-07-03 DIAGNOSIS — R109 Unspecified abdominal pain: Secondary | ICD-10-CM | POA: Insufficient documentation

## 2013-07-03 DIAGNOSIS — O039 Complete or unspecified spontaneous abortion without complication: Secondary | ICD-10-CM | POA: Insufficient documentation

## 2013-07-03 LAB — CBC
Platelets: 311 10*3/uL (ref 150–400)
RBC: 4.76 MIL/uL (ref 3.87–5.11)
RDW: 14.5 % (ref 11.5–15.5)
WBC: 5.6 10*3/uL (ref 4.0–10.5)

## 2013-07-03 MED ORDER — HYDROMORPHONE HCL PF 1 MG/ML IJ SOLN
1.0000 mg | Freq: Once | INTRAMUSCULAR | Status: DC
  Filled 2013-07-03: qty 1

## 2013-07-03 MED ORDER — OXYCODONE-ACETAMINOPHEN 5-325 MG PO TABS
1.0000 | ORAL_TABLET | Freq: Once | ORAL | Status: AC
  Administered 2013-07-03: 1 via ORAL
  Filled 2013-07-03: qty 1

## 2013-07-03 MED ORDER — KETOROLAC TROMETHAMINE 60 MG/2ML IM SOLN
60.0000 mg | Freq: Once | INTRAMUSCULAR | Status: AC
  Administered 2013-07-03: 60 mg via INTRAMUSCULAR
  Filled 2013-07-03: qty 2

## 2013-07-03 MED ORDER — HYDROMORPHONE HCL PF 1 MG/ML IJ SOLN
1.0000 mg | Freq: Once | INTRAMUSCULAR | Status: AC
  Administered 2013-07-03: 1 mg via INTRAVENOUS

## 2013-07-03 MED ORDER — ONDANSETRON HCL 4 MG/2ML IJ SOLN
4.0000 mg | Freq: Once | INTRAMUSCULAR | Status: AC
  Administered 2013-07-03: 4 mg via INTRAVENOUS
  Filled 2013-07-03: qty 2

## 2013-07-03 NOTE — MAU Provider Note (Signed)
History     CSN: [id number]  Arrival date and time: 07/03/13 1056   First Provider Initiated Contact with Patient 07/03/13 1121      Chief Complaint  Patient presents with  . Vaginal Bleeding  . Abdominal Pain   HPI  Ms. NORVA BOWE is a 32 y.o. female; G2P0010 at [redacted]w[redacted]d who presents to MAU for worsening abdominal pain; this is her 3rd visit to MAU since 9/17. She presents today stating that her abdominal pain is 10/10; the pain is throughout her entire abdomen. The patient was doubled over in the lobby and was brought directly back to a room in a wheelchair. The patient was prescribed percocet at her last visit, however she has not taken anything for the pain. Her Beta Hcg on 9/17: 596; Beta Hcg 9/18: 659; Beta Hcg 9/20: 115 Vaginal bleeding has improved; decreased since the last visit, changing minimal pads throughout the day, noticed a few clots.   OB History   Grav Para Term Preterm Abortions TAB SAB Ect Mult Living   2    1 1           Past Medical History  Diagnosis Date  . Heart murmur     Past Surgical History  Procedure Laterality Date  . Cardiac surgery      When she was 27 or 32 years old    History reviewed. No pertinent family history.  History  Substance Use Topics  . Smoking status: Current Some Day Smoker -- 0.25 packs/day  . Smokeless tobacco: Never Used  . Alcohol Use: No    Allergies:  Allergies  Allergen Reactions  . Naproxen Palpitations  . Tramadol Palpitations    Also gives her migraines     Prescriptions prior to admission  Medication Sig Dispense Refill  . oxyCODONE-acetaminophen (PERCOCET/ROXICET) 5-325 MG per tablet Take 1-2 tablets by mouth every 4 (four) hours as needed for pain.  15 tablet  0    US Ob Transvaginal  07/03/2013   CLINICAL DATA:  Pelvic pain. Early pregnancy. Dropping beta HCG level.  EXAM: TRANSVAGINAL OB ULTRASOUND  TECHNIQUE: Transvaginal ultrasound was performed for complete evaluation of the gestation as well as  the maternal uterus, adnexal regions, and pelvic cul-de-sac.  COMPARISON:  07/01/2013  FINDINGS: Intrauterine gestational sac: Absent  Yolk sac:  Absent  Embryo:  Absent  Cardiac Activity: Not applicable  Heart Rate:  bpm  MSD:   mm    w     d  CRL:     mm    w  d                  [place] EDC:  Maternal uterus/adnexae: No adnexal mass identified. There is a trace amount of free pelvic fluid. Retroverted uterus.  IMPRESSION: 1. Intrauterine gestational sac is no longer visible and the quantitative beta HCG is dropping. This constellation of findings strongly favors spontaneous abortion. Followup both quantitative beta HCG trend to ensure expected drop-off is suggested.   Electronically Signed   By: Herbie Baltimore   On: 07/03/2013 12:23   US Ob Transvaginal  07/01/2013   *RADIOLOGY REPORT*  Clinical Data: Pelvic pain.  Follow-up intrauterine gestational sac.  TRANSVAGINAL OBSTETRIC US  Technique:  Transvaginal ultrasound was performed for complete evaluation of the gestation as well as the maternal uterus, adnexal regions, and pelvic cul-de-sac.  Comparison:  06/30/2013  Intrauterine gestational sac: Visualized/normal in shape. Yolk sac: Not visualized Embryo: Not visualized Cardiac Activity: Not visualized  MSD: 6  mm  5    w 0    d           [place] EDC: 03/03/14  Maternal uterus/adnexae: The ovaries are normal.  A small amount of anechoic free fluid is noted at the right uterine fundus/adnexa.  Normal-appearing bowel is incidentally noted in the right adnexa.  IMPRESSION: Interval slight growth of the intrauterine gestational sac as expected given the extremely short time interval since yesterday's exam.  No yolk sac or fetal pole are yet visualized but would not necessarily be expected at this early gestational age.  An area questioned by the sonographer initially in the right adnexa was proven to represent bowel on subsequent imaging by a different sonographer.  No adnexal mass is identified.  Given the presence of an  IUP, a non visualized heterotopic pregnancy is statistically very unlikely, but theoretically possible.  If clinically appropriate, ultrasound followup could be performed in 10-14 days unless the patient's clinical condition mandates treatment earlier.   Original Report Authenticated By: Christiana Pellant, M.D.     Review of Systems  Constitutional: Positive for chills. Negative for fever.  Gastrointestinal: Positive for nausea, vomiting and abdominal pain. Negative for diarrhea and constipation.       Pain is throughout her abdomen   Genitourinary: Negative for dysuria, urgency, frequency and hematuria.       No vaginal discharge. + vaginal bleeding. No dysuria.   Neurological: Negative for headaches.   Physical Exam   Blood pressure 114/47, pulse 81, temperature 98.4 F (36.9 C), temperature source Oral, resp. rate 81, height 5\' 5"  (1.651 m), weight 84.369 kg (186 lb), last menstrual period 05/13/2013.  Physical Exam  Constitutional: She is oriented to person, place, and time. She appears well-developed and well-nourished. No distress.  Neck: Neck supple.  Respiratory: Effort normal.  GI: Soft. She exhibits no distension. There is tenderness.  Abdominal tenderness throughout   Neurological: She is alert and oriented to person, place, and time.  Skin: Skin is warm and dry. She is not diaphoretic.  Psychiatric: Her mood appears anxious. She is agitated.    MAU Course  Procedures  MDM CBC Beta Hcg O positive blood type  LR at 125 ml/hour Dilaudid 1 mg IV times 1 dose Zofran 4 mg IV times 1 dose [place] Consulted with Dr.Arnold at 1135; informed him of patients arrival with increased abdominal pain and increased vaginal bleeding  1245: Consulted with Dr. Debroah Loop regarding [place] results and beta Hcg; plan to have patient follow up in the clinic in 1 week; referral sent 1250: pt rates abdominal pain 7/10; cramping throughout abdomen.  60 mg toradol IM Percocet 5/325 mg PO times 1 dose    Assessment and Plan  A: SAB  P: Discharge home Pt has RX for percocet; instructed to use as directed on the bottle Ok to take ibuprofen as directed on the bottle Follow up in the clinic in 1 week; clinic will call her to schedule/confirm appointment  Return to MAU with worsening symptoms Bleeding precautions discussed Pelvic rest discussed  Support given   RASCH, JENNIFER IRENE FNP-C 07/03/2013, 1:15 PM

## 2013-07-03 NOTE — MAU Note (Signed)
Found out pregnant 06/15/13.  No problems with pregnancy.  Was seen in MAU 2 for bleeding days ago and told to come back today for more blood.  Had u/s and HCG levels wanted to make sure levels were increasing.

## 2013-07-14 ENCOUNTER — Emergency Department (HOSPITAL_COMMUNITY)
Admission: EM | Admit: 2013-07-14 | Discharge: 2013-07-14 | Disposition: A | Discharge status: Home / Self Care | Payer: 59 | Attending: Emergency Medicine | Admitting: Emergency Medicine

## 2013-07-14 ENCOUNTER — Encounter (HOSPITAL_COMMUNITY): Payer: Self-pay

## 2013-07-14 DIAGNOSIS — F17200 Nicotine dependence, unspecified, uncomplicated: Secondary | ICD-10-CM | POA: Insufficient documentation

## 2013-07-14 DIAGNOSIS — Z91040 Latex allergy status: Secondary | ICD-10-CM | POA: Insufficient documentation

## 2013-07-14 DIAGNOSIS — R011 Cardiac murmur, unspecified: Secondary | ICD-10-CM | POA: Insufficient documentation

## 2013-07-14 DIAGNOSIS — B86 Scabies: Secondary | ICD-10-CM | POA: Insufficient documentation

## 2013-07-14 MED ORDER — PERMETHRIN 5 % EX CREA: TOPICAL_CREAM | CUTANEOUS | Status: DC

## 2013-07-14 MED ORDER — DIPHENHYDRAMINE HCL 25 MG PO CAPS
25.0000 mg | ORAL_CAPSULE | Freq: Once | ORAL | Status: AC
  Administered 2013-07-14: 25 mg via ORAL
  Filled 2013-07-14: qty 1

## 2013-07-14 NOTE — ED Notes (Signed)
She has pruritic rash at bilat. Wrists/ankles.  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  She is in no distress.

## 2013-07-14 NOTE — ED Provider Notes (Signed)
Medical screening examination/treatment/procedure(s) were performed by non-physician practitioner and as supervising physician I was immediately available for consultation/collaboration.     Chukwudi Ewen R Johnita Palleschi, MD 07/14/13 2356 

## 2013-07-14 NOTE — ED Provider Notes (Signed)
CSN: [id number]     Arrival date & time 07/14/13  1314 History  This chart was scribed for Roxy Horseman, PA, working with Harrold Donath R. Rubin Payor, MD, by Ardelia Mems ED Scribe. This patient was seen in room WTR3/WLPT3 and the patient's care was started at 3:14 PM.   Chief Complaint  Patient presents with  . Insect Bite    The history is provided by the patient. No language interpreter was used.    HPI Comments: KENDELL GAMMON is a 32 y.o. female who presents to the Emergency Department complaining of a rash to her bilateral wrists and ankles. She states that the rash itches. 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 She states that she believes she was bitten by an insect. She states that she has applied Hydrocortisone without relief. She states that she has not used any oral medications. She denies any other pain or symptoms.   Past Medical History  Diagnosis Date  . Heart murmur    Past Surgical History  Procedure Laterality Date  . Cardiac surgery      When she was 78 or 32 years old   History reviewed. No pertinent family history. History  Substance Use Topics  . Smoking status: Current Some Day Smoker -- 0.25 packs/day  . Smokeless tobacco: Never Used  . Alcohol Use: No   OB History   Grav Para Term Preterm Abortions TAB SAB Ect Mult Living   2    1 1          Review of Systems  All other systems reviewed and are negative.  A complete 10 system review of systems was obtained and all systems are negative except as noted in the HPI and PMH.   Allergies  Latex; Naproxen; and Tramadol  Home Medications   Current Outpatient Rx  Name  Route  Sig  Dispense  Refill  . acetaminophen (TYLENOL) 325 MG tablet   Oral   Take 650 mg by mouth every 6 (six) hours as needed for pain.         Marland Kitchen ibuprofen (ADVIL,MOTRIN) 200 MG tablet   Oral   Take 800 mg by mouth every 6 (six) hours as needed for pain.           Triage Vitals: BP 111/65  Pulse 97  Temp(Src) 98.1 F (36.7 C) (Oral)  Resp 16  SpO2 100%  LMP 05/13/2013  Physical Exam  Nursing note and vitals reviewed. Constitutional: She is oriented to person, place, and time. She appears well-developed and well-nourished. No distress.  HENT:  Head: Normocephalic and atraumatic.  Eyes: EOM are normal.  Neck: Neck supple. No tracheal deviation present.  Cardiovascular: Normal rate.   Pulmonary/Chest: Effort normal. No respiratory distress.  Musculoskeletal: Normal range of motion.  Neurological: She is alert and oriented to person, place, and time.  Skin: Skin is warm and dry. Rash noted.  Scattered bite marks to the lower extremities, characteristic of scabies.  Psychiatric: She has a normal mood and affect. Her behavior is normal.    ED Course  Procedures (including critical care time)  DIAGNOSTIC STUDIES: Oxygen Saturation is 100% on RA, normal by my interpretation.    COORDINATION OF CARE: 3:18 PM- Discussed clinical suspicion of scabies. Advised pt of plan to receive Benadryl in the ED, and of plan to be discharged with a prescription for Permethrin. Pt advised of plan for treatment and pt agrees.  Labs Review Labs Reviewed - No data to display Imaging Review No results found.  MDM   1. Scabies    Discussed diagnosis & treatment of scabies with patient.  They have been advised to followup with her primary care doctor 2 weeks after treatment.  They have also been advised to clean entire household including washing sheets and using R.I.D. spray in the car and on sofa.   The use of permethrin cream was discussed as well, they were told to use cream from head to toe & leave on child for 8-12 hours.  They've been advised to repeat treatment if new eruptions occur. Patient's parents verbalized understanding.    I personally performed the services described in this documentation, which was scribed in my presence. The recorded  information has been reviewed and is accurate.    Roxy Horseman, PA-C 07/14/13 1850

## 2013-07-16 ENCOUNTER — Encounter: Payer: Self-pay | Admitting: Medical

## 2013-07-16 ENCOUNTER — Inpatient Hospital Stay (HOSPITAL_COMMUNITY)
Admission: AD | Admit: 2013-07-16 | Discharge: 2013-07-16 | Disposition: A | Discharge status: Home / Self Care | Payer: 59 | Source: Ambulatory Visit | Attending: Obstetrics & Gynecology | Admitting: Obstetrics & Gynecology

## 2013-07-16 ENCOUNTER — Ambulatory Visit (INDEPENDENT_AMBULATORY_CARE_PROVIDER_SITE_OTHER): Payer: 59 | Admitting: Medical

## 2013-07-16 VITALS — BP 112/83 | HR 80 | Ht 65.0 in | Wt 186.8 lb

## 2013-07-16 DIAGNOSIS — O039 Complete or unspecified spontaneous abortion without complication: Secondary | ICD-10-CM

## 2013-07-16 NOTE — Progress Notes (Signed)
Patient ID: Cassandra Mann, female   DOB: 23-Dec-1980, 32 y.o.   MRN: [id number]  History:  Cassandra Mann is a 32 y.o. G2P0010 who presents to clinic today for follow-up after SAB. SAB diagnosed in MAU on 07/03/13. Patient states that bleeding stopped within a few days of last MAU visit. She denies pelvic pain at this time. She has not yet had a normal period. She had a prenatal appointment scheduled with [facility] last week and decided to keep the appointment to establish care with a GYN. She was given Rx for OCPs that she has not started yet. She has not yet resumed sexual activity. She does not plan to attempt to conceive x 1 year. She is still very upset over the SAB. She denies suicidal or homicidal ideations. She is having trouble with her job because they are not accepting her excuses from the MAU visits for the SAB.   The following portions of the patient's history were reviewed and updated as appropriate: allergies, current medications, past family history, past medical history, past social history, past surgical history and problem list.  Review of Systems:  Pertinent items are noted in HPI.  Objective:  Physical Exam BP 112/83  Pulse 80  Ht 5\' 5"  (1.651 m)  Wt 186 lb 12.8 oz (84.732 kg)  BMI 31.09 kg/m2  LMP 05/13/2013  Breastfeeding? Unknown GENERAL: Well-developed, well-nourished female in no acute distress.  HEENT: Normocephalic, atraumatic.   LUNGS: Effort normal HEART: Regular rate  SKIN: Warm, dry and without erythema PSYCH: Normal mood and affect   Labs and Imaging US Ob Comp Less 14 Wks  06/30/2013   *RADIOLOGY REPORT*  Clinical Data: Pelvic pain.  Positive pregnancy test.  OBSTETRIC <14 WK [place] AND TRANSVAGINAL OB US  Technique:  Both transabdominal and transvaginal ultrasound examinations were performed for complete evaluation of the gestation as well as the maternal uterus, adnexal regions, and pelvic cul-de-sac.  Transvaginal technique was performed to  assess early pregnancy.  Comparison:  None.  Intrauterine gestational sac:  Visualized/normal in shape. Yolk sac: Not visualized Embryo: Not visualized Cardiac Activity: Not visualized  MSD: 4 mm  4 w 6 d      [place] EDC: 03/03/2014  Maternal uterus/adnexae: Probable right ovarian corpus luteum.  Left ovary is normal.  Small free fluid noted at the fundus.  IMPRESSION: Single intrauterine gestational sac without yolk sac, fetal pole, or cardiac activity yet visualized.  Follow-up is recommended in 10- 14 days to document appropriate pregnancy progression.  The patient was in significant pain at the time of the examination which raises the question of a possible non visualized heterotopic pregnancy, although this would be much less likely statistically.   Original Report Authenticated By: Christiana Pellant, M.D.   US Ob Transvaginal  07/01/2013   *RADIOLOGY REPORT*  Clinical Data: Pelvic pain.  Follow-up intrauterine gestational sac.  TRANSVAGINAL OBSTETRIC US  Technique:  Transvaginal ultrasound was performed for complete evaluation of the gestation as well as the maternal uterus, adnexal regions, and pelvic cul-de-sac.  Comparison:  06/30/2013  Intrauterine gestational sac: Visualized/normal in shape. Yolk sac: Not visualized Embryo: Not visualized Cardiac Activity: Not visualized  MSD: 6  mm  5    w 0    d           [place] EDC: 03/03/14  Maternal uterus/adnexae: The ovaries are normal.  A small amount of anechoic free fluid is noted at the right uterine fundus/adnexa.  Normal-appearing bowel is  incidentally noted in the right adnexa.  IMPRESSION: Interval slight growth of the intrauterine gestational sac as expected given the extremely short time interval since yesterday's exam.  No yolk sac or fetal pole are yet visualized but would not necessarily be expected at this early gestational age.  An area questioned by the sonographer initially in the right adnexa was proven to represent bowel on subsequent imaging by a  different sonographer.  No adnexal mass is identified.  Given the presence of an IUP, a non visualized heterotopic pregnancy is statistically very unlikely, but theoretically possible.  If clinically appropriate, ultrasound followup could be performed in 10-14 days unless the patient's clinical condition mandates treatment earlier.   Original Report Authenticated By: Christiana Pellant, M.D.  US Ob Transvaginal  07/03/2013   CLINICAL DATA:  Pelvic pain. Early pregnancy. Dropping beta HCG level.  EXAM: TRANSVAGINAL OB ULTRASOUND  TECHNIQUE: Transvaginal ultrasound was performed for complete evaluation of the gestation as well as the maternal uterus, adnexal regions, and pelvic cul-de-sac.  COMPARISON:  07/01/2013  FINDINGS: Intrauterine gestational sac: Absent  Yolk sac:  Absent  Embryo:  Absent  Cardiac Activity: Not applicable  Heart Rate:  bpm  MSD:   mm    w     d  CRL:     mm    w  d                  [place] EDC:  Maternal uterus/adnexae: No adnexal mass identified. There is a trace amount of free pelvic fluid. Retroverted uterus.  IMPRESSION: 1. Intrauterine gestational sac is no longer visible and the quantitative beta HCG is dropping. This constellation of findings strongly favors spontaneous abortion. Followup both quantitative beta HCG trend to ensure expected drop-off is suggested.   Electronically Signed   By: Herbie Baltimore   On: 07/03/2013 12:23     Assessment & Plan:  Assessment: SAB  Plans: 1. Quant hCG today 2. Patient will follow-up with [facility] in the future 3. Patient may return to [facility] clinic PRN  Freddi Starr, PA-C 07/16/2013 9:48 AM

## 2013-07-16 NOTE — Patient Instructions (Addendum)
Oral Contraception Use  Oral contraceptives (OCs) are medicines taken to prevent pregnancy. OCs work by preventing the ovaries from releasing eggs. The hormones in OCs also cause the cervical mucus to thicken, preventing the sperm from entering the uterus. The hormones also cause the uterine lining to become thin, not allowing a fertilized egg to attach to the inside of the uterus. OCs are highly effective when taken exactly as prescribed. However, OCs do not prevent sexually transmitted diseases (STDs). Safe sex practices, such as using condoms along with an OC, can help prevent STDs.   Before taking OCs, you may have a physical exam and Pap test. Your caregiver may also order blood tests if necessary. Your caregiver will make sure you are a good candidate for oral contraception. Discuss with your caregiver the possible side effects of the OC you may be prescribed. When starting an OC, it can take 2 to 3 months for the body to adjust to the changes in hormone levels in your body.   HOW TO TAKE ORAL CONTRACEPTIVES  Your caregiver may advise you on how to start taking the first cycle of OCs. Otherwise, you can:  · Start on day 1 of your menstrual period. You will not need any backup contraceptive protection with this start time.  · Start on the first Sunday after your menstrual period or the day you get your prescription. In these cases, you will need to use backup contraceptive protection for the first 7-day cycle.  After you have started taking OCs:  · If you forget to take 1 pill, take it as soon as you remember. Take the next pill at the regular time.  · If you miss 2 or more pills, use backup birth control until your next menstrual period starts.  · If you use a 28-day pack that contains inactive pills and you miss 1 of the last 7 pills (pills with no hormones), it will not matter. Throw away the rest of the non-hormone pills and start a new pill pack.  No matter which day you start the OC, you will always start  a new pack on that same day of the week. Have an extra pack of OCs and a backup contraceptive method available in case you miss some pills or lose your OC pack.  HOME CARE INSTRUCTIONS   · Do not smoke.  · Always use a condom to protect against STDs. OCs do not protect against STDs.  · Use a calendar to mark your menstrual period days.  · Read the information and directions that come with your OC. Talk to your caregiver if you have questions.  SEEK MEDICAL CARE IF:   · You develop nausea and vomiting.  · You have abnormal vaginal discharge or bleeding.  · You develop a rash.  · You miss your menstrual period.  · You are losing your hair.  · You need treatment for mood swings or depression.  · You get dizzy when taking the OC.  · You develop acne from taking the OC.  · You become pregnant.  SEEK IMMEDIATE MEDICAL CARE IF:   · You develop chest pain.  · You develop shortness of breath.  · You have an uncontrolled or severe headache.  · You develop numbness or slurred speech.  · You develop visual problems.  · You develop pain, redness, and swelling in the legs.  Document Released: 09/19/2011 Document Revised: 12/23/2011 Document Reviewed: 09/19/2011  [employer]® Patient Information ©2014 [employer].

## 2013-07-17 LAB — HCG, QUANTITATIVE, PREGNANCY: hCG, Beta Chain, Quant, S: <2 m[IU]/mL

## 2013-07-20 ENCOUNTER — Telehealth: Payer: Self-pay | Admitting: *Deleted

## 2013-07-20 NOTE — Telephone Encounter (Addendum)
Message copied by Jill Side on Tue Jul 20, 2013 11:41 AM ------      Message from: Freddi Starr      Created: Sat Jul 17, 2013  8:31 AM       Please inform patient that her quant hCG is now normal for a non-pregnant patient. She should start her OCPs if she hasn't already and be sure to use a back-up method of birth control until at least the first month of pills is completed to avoid unwanted pregnancy. Thanks!  ------  Called pt and left message that I am calling with test result information and recommendation. Please call back and indicate whether we may leave detailed information on her voice mail.        10/7  1220 - pt left message stating that a detailed message may be left on her voice mail. I called back and stated that her hormone level is now normal for a non-pregnant female. She may begin her OCP's and be sure to use a back up method of birth control, such as condoms for the first full month of pills. Once she has had a period at the end of her pack, she may use the pills only as her method of birth control. She may call our office for any additional questions or contact the office of [facility].

## 2014-03-03 ENCOUNTER — Ambulatory Visit (INDEPENDENT_AMBULATORY_CARE_PROVIDER_SITE_OTHER): Payer: 59 | Admitting: Sports Medicine

## 2014-03-03 ENCOUNTER — Ambulatory Visit
Admission: RE | Admit: 2014-03-03 | Discharge: 2014-03-03 | Disposition: A | Discharge status: Home / Self Care | Payer: 59 | Source: Ambulatory Visit | Attending: Family Medicine | Admitting: Family Medicine

## 2014-03-03 ENCOUNTER — Encounter: Payer: Self-pay | Admitting: Sports Medicine

## 2014-03-03 VITALS — BP 103/68 | HR 91 | Ht 65.0 in | Wt 181.0 lb

## 2014-03-03 DIAGNOSIS — R109 Unspecified abdominal pain: Secondary | ICD-10-CM

## 2014-03-03 LAB — CBC WITH DIFFERENTIAL/PLATELET
BASOS ABS: 0 10*3/uL (ref 0.0–0.1)
BASOS PCT: 0 % (ref 0–1)
EOS ABS: 0.1 10*3/uL (ref 0.0–0.7)
Eosinophils Relative: 1 % (ref 0–5)
HCT: 42.6 % (ref 36.0–46.0)
HEMOGLOBIN: 14.5 g/dL (ref 12.0–15.0)
LYMPHS ABS: 2.7 10*3/uL (ref 0.7–4.0)
LYMPHS PCT: 45 % (ref 12–46)
MCH: 29.9 pg (ref 26.0–34.0)
MCHC: 34 g/dL (ref 30.0–36.0)
MCV: 87.8 fL (ref 78.0–100.0)
MONO ABS: 0.4 10*3/uL (ref 0.1–1.0)
MONOS PCT: 6 % (ref 3–12)
NEUTROS PCT: 48 % (ref 43–77)
Neutro Abs: 2.8 10*3/uL (ref 1.7–7.7)
PLATELETS: 373 10*3/uL (ref 150–400)
RBC: 4.85 MIL/uL (ref 3.87–5.11)
RDW: 14.2 % (ref 11.5–15.5)
WBC: 5.9 10*3/uL (ref 4.0–10.5)

## 2014-03-03 LAB — COMPREHENSIVE METABOLIC PANEL
ALT: 16 U/L (ref 0–35)
AST: 17 U/L (ref 0–37)
Albumin: 4.2 g/dL (ref 3.5–5.2)
Alkaline Phosphatase: 81 U/L (ref 39–117)
BILIRUBIN TOTAL: 0.3 mg/dL (ref 0.2–1.2)
BUN: 10 mg/dL (ref 6–23)
CO2: 24 meq/L (ref 19–32)
Calcium: 9.7 mg/dL (ref 8.4–10.5)
Chloride: 104 mEq/L (ref 96–112)
Creat: 0.87 mg/dL (ref 0.50–1.10)
GLUCOSE: 91 mg/dL (ref 70–99)
Potassium: 4.8 mEq/L (ref 3.5–5.3)
SODIUM: 137 meq/L (ref 135–145)
TOTAL PROTEIN: 7.3 g/dL (ref 6.0–8.3)

## 2014-03-03 LAB — POCT URINALYSIS DIPSTICK
Bilirubin, UA: NEGATIVE
Glucose, UA: NEGATIVE
KETONES UA: NEGATIVE
Leukocytes, UA: NEGATIVE
Nitrite, UA: NEGATIVE
PH UA: 7.5
RBC UA: NEGATIVE
SPEC GRAV UA: 1.02
UROBILINOGEN UA: 0.2

## 2014-03-03 LAB — POCT URINE PREGNANCY: Preg Test, Ur: NEGATIVE

## 2014-03-03 NOTE — Patient Instructions (Addendum)
We are checking labs today. Please go get an X-ray Do not take immodium. Ensure you drink plenty of water.  Follow up with Dr. Jarvis NewcomerGrunz in the next 2-3 weeks to discuss your ongoing medical care.

## 2014-03-03 NOTE — Progress Notes (Signed)
  Cassandra Mann - 33 y.o. female MRN [id number]  Date of birth: 1980/11/19  CC, SUBJECTIVE & ROS:     If applicable, see problem based charting for additional problem specific documentation. HPI Chief Complaint  Patient presents with  . Abdominal Pain    worse with BM, ; no fevers; recent C.dif exposure  . Nausea    no vomiting  . Diarrhea    tan; 7-8Xs per day, ; no blood; no melana   Symptoms as above.  Grandmother has recently been discharged from the hospital with an acute Clostridium difficile infection.  Symptoms began approximately one week following her grandmothers diagnosis.  She denies any vaginal discharge, dysuria, frequency or bilateral flank pain.  There is no change in her symptoms with eating and her abdominal pain seems to worsen with each bowel movement.  Pt denies any fevers, chills, or rigors.  No new sexual contacts.  HISTORY: Past Medical, Surgical, Social, and Family History Reviewed & Updated per EMR.  Pertinent Historical Findings include: History of migraines, otherwise generally healthy  OBJECTIVE:  BP:103/68 mmHg  HR:91bpm  TEMP: ( )  RESP:   HT:5\' 5"  (165.1 cm)   WT:181 lb (82.101 kg)  BMI:30.2 Physical Exam  Vitals reviewed. Constitutional: She is well-developed, well-nourished, and in no distress.  HENT:  Head: Normocephalic and atraumatic.  Right Ear: External ear normal.  Left Ear: External ear normal.  Neck: No JVD present. No tracheal deviation present.  Cardiovascular: Normal rate, regular rhythm and normal heart sounds.  Exam reveals no gallop and no friction rub.   No murmur heard. Pulmonary/Chest: Effort normal and breath sounds normal. No respiratory distress. She has no wheezes. She exhibits no tenderness.  Abdominal: Soft. Bowel sounds are normal. She exhibits no distension and no mass. There is tenderness (Diffusely tender but worse over the epigastrium.  Negative Murphy's, negative McBurney's). There is no rebound and no guarding.    Musculoskeletal: She exhibits no edema and no tenderness.  Neurological: She is alert.  Moves all 4 extremities spontaneously; no lateralization.  Skin: Skin is warm and dry. She is not diaphoretic.  Psychiatric: Mood, memory, affect and judgment normal.    MEDICATIONS, LABS & OTHER ORDERS: Previous Medications   No medications on file   Modified Medications   No medications on file   New Prescriptions   FLUCONAZOLE (DIFLUCAN) 150 MG TABLET    Take 1 tablet (150 mg total) by mouth once.   METRONIDAZOLE (FLAGYL) 500 MG TABLET    Take 1 tablet (500 mg total) by mouth 3 (three) times daily.   Orders Placed This Encounter  Procedures  . Clostridium difficile EIA  . DG Abd Acute W/Chest  . Comprehensive metabolic panel  . CBC with Differential  . POCT urinalysis dipstick  . POCT urine pregnancy   ASSESSMENT & PLAN: See problem based charting & AVS for pt instructions.

## 2014-03-03 NOTE — Progress Notes (Deleted)
HPI  Physical Exam   

## 2014-03-04 ENCOUNTER — Other Ambulatory Visit: Payer: Self-pay | Admitting: Family Medicine

## 2014-03-04 DIAGNOSIS — O26899 Other specified pregnancy related conditions, unspecified trimester: Secondary | ICD-10-CM | POA: Insufficient documentation

## 2014-03-04 DIAGNOSIS — R102 Pelvic and perineal pain: Secondary | ICD-10-CM

## 2014-03-04 MED ORDER — FLUCONAZOLE 150 MG PO TABS: 150.0000 mg | ORAL_TABLET | Freq: Once | ORAL | Status: DC

## 2014-03-04 MED ORDER — METRONIDAZOLE 500 MG PO TABS: 500.0000 mg | ORAL_TABLET | Freq: Three times a day (TID) | ORAL | Status: DC

## 2014-03-04 NOTE — Assessment & Plan Note (Signed)
Acute diarrheal condition  -  Recent exposure C. difficile via grandmother who was just discharged from the hospital with it.  She reports voluminous, nonbloody, non-melanotic stools.  Putrid.  Nonacute abdominal exam with equivocal Murphy's sign 1. Basic labwork 2. Acute abdomen x-ray including chest given epigastric focality  >>> Given the upcoming holiday weekend and persistence of diarrhea 24 hours after office visit and reassuring labwork otherwise while awaiting C. difficile results will empirically treat with Flagyl and continue to encourage by mouth hydration.  If C. difficile testing is negative we will need to contact patient to stop her antibiotics.

## 2014-03-05 LAB — C. DIFFICILE GDH AND TOXIN A/B
C. difficile GDH: NOT DETECTED
C. difficile Toxin A/B: NOT DETECTED

## 2014-03-09 ENCOUNTER — Telehealth: Payer: Self-pay | Admitting: Sports Medicine

## 2014-03-09 NOTE — Telephone Encounter (Signed)
LVM regarding negative C.Dif test.   Patient is to stop Flagyl given likely no benefit but okay to continue it she is feeling better on i given underlying potential infectious etiology and a half course already completed due to the holiday weekend.

## 2014-03-22 ENCOUNTER — Ambulatory Visit (INDEPENDENT_AMBULATORY_CARE_PROVIDER_SITE_OTHER): Payer: 59 | Admitting: Family Medicine

## 2014-03-22 ENCOUNTER — Encounter: Payer: Self-pay | Admitting: Family Medicine

## 2014-03-22 VITALS — BP 112/67 | HR 75 | Temp 98.1°F | Ht 65.0 in | Wt 183.7 lb

## 2014-03-22 DIAGNOSIS — R109 Unspecified abdominal pain: Secondary | ICD-10-CM

## 2014-03-22 DIAGNOSIS — G44229 Chronic tension-type headache, not intractable: Secondary | ICD-10-CM

## 2014-03-22 DIAGNOSIS — R300 Dysuria: Secondary | ICD-10-CM

## 2014-03-22 DIAGNOSIS — N946 Dysmenorrhea, unspecified: Secondary | ICD-10-CM

## 2014-03-22 LAB — POCT URINALYSIS DIPSTICK
Bilirubin, UA: NEGATIVE
Blood, UA: NEGATIVE
Glucose, UA: NEGATIVE
KETONES UA: NEGATIVE
Leukocytes, UA: NEGATIVE
NITRITE UA: NEGATIVE
PROTEIN UA: NEGATIVE
Spec Grav, UA: 1.015
Urobilinogen, UA: 0.2
pH, UA: 7

## 2014-03-22 MED ORDER — MELOXICAM 15 MG PO TABS: 15.0000 mg | ORAL_TABLET | Freq: Every day | ORAL | Status: DC

## 2014-03-22 NOTE — Assessment & Plan Note (Signed)
With menorrhagia. Followed by [facility] who want to perform laparoscopy for endometriosis which provokes much anxiety.

## 2014-03-22 NOTE — Assessment & Plan Note (Signed)
2-3 episodes per week affecting her life significantly. Recommend excedrin migraine and Rx mobic prn as these have been relieved with toradol in the past. Also advised that IM toradol can be given in office as needed.

## 2014-03-22 NOTE — Assessment & Plan Note (Signed)
Urinalysis normal. Reassured that history/time course is more consistent with endometriosis. Possibly part of somatization s/p miscarriage in Sept 2014.

## 2014-03-22 NOTE — Patient Instructions (Addendum)
Thank you for coming in today!  - Start taking mobic for headaches as needed at the first sign of a headache  - Try Beano for your stomach issues. I believe this will help a lot  - Consider using condoms or other protection - Follow up with your OB/GYN - Consider quitting smoking  For help with quitting smoking, please talk to your doctor or contact the [employer]: 24/7 toll-free at [employer] [phone]).   As you leave, make an appointment to follow up with me in 1 year.   Take care and seek immediate care sooner if you develop any concerns.  Please feel free to call with any questions or concerns at any time, at [phone]. - Dr. Jarvis Newcomer

## 2014-03-22 NOTE — Progress Notes (Signed)
Patient ID: Cassandra Mann, female   DOB: [birth year], 33 y.o.   MRN: [id number]   Subjective:  HPI:   Cassandra Mann is a 33 y.o. female with a history of migraines here for complete physical exam and dysuria.  Dysuria: Reports pain with urination, and urinary frequency. Denies urgency, vaginal discharge. +Dyspareunia, which she believes is related to endometriosis, which her OB has suspected contributed to her miscarriage and menorrhagia.  Last pap smear: 2014 ([facility] does this)  She had a miscarriage in Sept 2014 and has had complaints of dysuria, stomach upset, nausea, intermittent shortness of breath not related to exertion since that time. She does not believe this is a manifestation of increased anxiety.   She is currently sexually active with a single female partner. They do not use contraception other than timing. Her menstrual period is 7-10 days every 28 days reliably. She has tried contraceptive methods in the past with side effects. She would NOT like to discuss contraception options.   Smoking 5 cigarettes per day. She has quit cold [place] before but does NOT want to talk about this today. She will "try Jesus" to quit. She prefers "homeopathic, holistic" remedies.   Drinks monthly or less, no binge drinking. Denies illicit drug use. Feels safe in her relationships. PHQ-2 neg.   Review of Systems:  Per HPI. All other systems reviewed and are negative. Objective:  Physical Exam: BP 112/67  Pulse 75  Temp(Src) 98.1 F (36.7 C) (Oral)  Ht 5\' 5"  (1.651 m)  Wt 183 lb 11.2 oz (83.326 kg)  BMI 30.57 kg/m2  LMP 03/15/2014  Gen: Obese 32 y.o. female in NAD HEENT: MMM, EOMI, PERRL, anicteric sclerae CV: RRR, no MRG, no JVD Resp: Non-labored, CTAB, no wheezes noted Abd: Soft, mild vague tenderness to palpation diffusely. ND, BS present, no guarding or organomegaly. No CVA or suprapubic tenderness.  MSK: No edema noted, full ROM Neuro: Alert and oriented, speech  normal Assessment:     Cassandra Mann is a 33 y.o. female here for CPE    Plan:     See problem list for problem-specific plans.

## 2014-03-22 NOTE — Assessment & Plan Note (Signed)
Continues. Non-specific. Recommended beano and close follow up if symptoms worsen/change. Suspect some element of somatization.

## 2014-08-15 ENCOUNTER — Encounter: Payer: Self-pay | Admitting: Family Medicine

## 2014-08-15 ENCOUNTER — Ambulatory Visit: Payer: 59 | Admitting: Family Medicine

## 2014-08-16 ENCOUNTER — Inpatient Hospital Stay (HOSPITAL_COMMUNITY): Payer: 59

## 2014-08-16 ENCOUNTER — Encounter (HOSPITAL_COMMUNITY): Payer: Self-pay | Admitting: *Deleted

## 2014-08-16 ENCOUNTER — Inpatient Hospital Stay (HOSPITAL_COMMUNITY)
Admission: AD | Admit: 2014-08-16 | Discharge: 2014-08-16 | Disposition: A | Discharge status: Home / Self Care | Payer: 59 | Source: Ambulatory Visit | Attending: Obstetrics & Gynecology | Admitting: Obstetrics & Gynecology

## 2014-08-16 DIAGNOSIS — R102 Pelvic and perineal pain: Secondary | ICD-10-CM | POA: Diagnosis present

## 2014-08-16 DIAGNOSIS — R109 Unspecified abdominal pain: Secondary | ICD-10-CM | POA: Diagnosis not present

## 2014-08-16 DIAGNOSIS — O9989 Other specified diseases and conditions complicating pregnancy, childbirth and the puerperium: Secondary | ICD-10-CM | POA: Insufficient documentation

## 2014-08-16 DIAGNOSIS — O26899 Other specified pregnancy related conditions, unspecified trimester: Secondary | ICD-10-CM

## 2014-08-16 DIAGNOSIS — Z3A01 Less than 8 weeks gestation of pregnancy: Secondary | ICD-10-CM | POA: Insufficient documentation

## 2014-08-16 HISTORY — DX: Headache: R51

## 2014-08-16 HISTORY — DX: Headache, unspecified: R51.9

## 2014-08-16 HISTORY — DX: Unspecified infectious disease: B99.9

## 2014-08-16 LAB — CBC
HCT: 36.6 % (ref 36.0–46.0)
Hemoglobin: 12.5 g/dL (ref 12.0–15.0)
MCH: 30 pg (ref 26.0–34.0)
MCHC: 34.2 g/dL (ref 30.0–36.0)
MCV: 88 fL (ref 78.0–100.0)
PLATELETS: 287 10*3/uL (ref 150–400)
RBC: 4.16 MIL/uL (ref 3.87–5.11)
RDW: 14.9 % (ref 11.5–15.5)
WBC: 7.8 10*3/uL (ref 4.0–10.5)

## 2014-08-16 LAB — URINALYSIS, ROUTINE W REFLEX MICROSCOPIC
Bilirubin Urine: NEGATIVE
Glucose, UA: NEGATIVE mg/dL
Hgb urine dipstick: NEGATIVE
KETONES UR: NEGATIVE mg/dL
LEUKOCYTES UA: NEGATIVE
NITRITE: NEGATIVE
PH: 8.5 — AB (ref 5.0–8.0)
Protein, ur: NEGATIVE mg/dL
Specific Gravity, Urine: 1.015 (ref 1.005–1.030)
UROBILINOGEN UA: 0.2 mg/dL (ref 0.0–1.0)

## 2014-08-16 LAB — HIV ANTIBODY (ROUTINE TESTING W REFLEX): HIV 1&2 Ab, 4th Generation: NONREACTIVE

## 2014-08-16 LAB — POCT PREGNANCY, URINE: Preg Test, Ur: POSITIVE — AB

## 2014-08-16 LAB — WET PREP, GENITAL
CLUE CELLS WET PREP: NONE SEEN
Trich, Wet Prep: NONE SEEN
Yeast Wet Prep HPF POC: NONE SEEN

## 2014-08-16 LAB — HCG, QUANTITATIVE, PREGNANCY: HCG, BETA CHAIN, QUANT, S: 13302 m[IU]/mL — AB (ref <5)

## 2014-08-16 MED ORDER — HYDROMORPHONE HCL 2 MG/ML IJ SOLN
2.0000 mg | Freq: Once | INTRAMUSCULAR | Status: AC
Start: 2014-08-16 — End: 2014-08-16
  Administered 2014-08-16: 2 mg via INTRAMUSCULAR
  Filled 2014-08-16: qty 1

## 2014-08-16 NOTE — MAU Provider Note (Signed)
History     CSN: [id number]  Arrival date and time: 08/16/14 1159   First Provider Initiated Contact with Patient 08/16/14 1248      Chief Complaint  Patient presents with  . Possible Pregnancy  . Abdominal Pain  . Nausea   HPI  Pt is a 33 yo G3P0020 at 8064w6d wks pregnancy here with left sided pelvic pain x one week. Pain is described as "crampy" and rated an 8/10.  No reports of bleeding and denies abnormal vaginal discharge.    Past Medical History  Diagnosis Date  . Heart murmur   . Headache   . Infection     UTI    Past Surgical History  Procedure Laterality Date  . Cardiac surgery      When she was 89 or [age] years old    Family History  Problem Relation Age of Onset  . Hypertension Mother   . Diabetes Father   . Hypertension Father   . Hyperlipidemia Father   . Alcohol abuse Maternal Grandmother     scirrosis  . Hypertension Maternal Grandmother   . Diabetes Maternal Grandmother   . Hyperlipidemia Maternal Grandmother   . Heart disease Maternal Grandmother   . Cancer Paternal Grandmother     ovarian  . Hearing loss Neg Hx     History  Substance Use Topics  . Smoking status: Current Some Day Smoker -- 0.25 packs/day for 5 years    Types: Cigarettes  . Smokeless tobacco: Never Used  . Alcohol Use: No    Allergies:  Allergies  Allergen Reactions  . Latex Itching  . Naproxen Palpitations  . Tramadol Palpitations    Also gives her migraines     Prescriptions prior to admission  Medication Sig Dispense Refill Last Dose  . fluconazole (DIFLUCAN) 150 MG tablet Take 1 tablet (150 mg total) by mouth once. 1 tablet 0   . meloxicam (MOBIC) 15 MG tablet Take 1 tablet (15 mg total) by mouth daily. 30 tablet 0   . metroNIDAZOLE (FLAGYL) 500 MG tablet Take 1 tablet (500 mg total) by mouth 3 (three) times daily. 30 tablet 0     Review of Systems  Constitutional: Negative for fever and chills.  Gastrointestinal: Positive for nausea and abdominal pain ( left  sided). Negative for vomiting.  Genitourinary: Positive for dysuria. Negative for urgency and frequency.  All other systems reviewed and are negative.  Physical Exam   Blood pressure 118/60, pulse 83, temperature 98.3 F (36.8 C), temperature source Oral, resp. rate 18, height 5' 6.5" (1.689 m), weight 85.548 kg (188 lb 9.6 oz), last menstrual period 07/06/2014, SpO2 100 %, unknown if currently breastfeeding.  Physical Exam  Constitutional: She is oriented to person, place, and time. She appears well-developed and well-nourished. No distress.  HENT:  Head: Normocephalic.  Neck: Normal range of motion. Neck supple.  Cardiovascular: Normal rate, regular rhythm and normal heart sounds.   Respiratory: Effort normal and breath sounds normal. No respiratory distress.  GI: Soft. There is tenderness (LLQ).  Genitourinary: Cervix exhibits no motion tenderness. Right adnexum displays no mass, no tenderness and no fullness. Left adnexum displays tenderness and fullness. Left adnexum displays no mass. No bleeding in the vagina.  Musculoskeletal: Normal range of motion.  Neurological: She is alert and oriented to person, place, and time.  Skin: Skin is warm and dry.    MAU Course  Procedures Results for orders placed or performed during the hospital encounter of 08/16/14 (from the  past 24 hour(s))  Urinalysis, Routine w reflex microscopic     Status: Abnormal   Collection Time: 08/16/14 12:22 PM  Result Value Ref Range   Color, Urine YELLOW YELLOW   APPearance HAZY (A) CLEAR   Specific Gravity, Urine 1.015 1.005 - 1.030   pH 8.5 (H) 5.0 - 8.0   Glucose, UA NEGATIVE NEGATIVE mg/dL   Hgb urine dipstick NEGATIVE NEGATIVE   Bilirubin Urine NEGATIVE NEGATIVE   Ketones, ur NEGATIVE NEGATIVE mg/dL   Protein, ur NEGATIVE NEGATIVE mg/dL   Urobilinogen, UA 0.2 0.0 - 1.0 mg/dL   Nitrite NEGATIVE NEGATIVE   Leukocytes, UA NEGATIVE NEGATIVE  Pregnancy, urine POC     Status: Abnormal   Collection  Time: 08/16/14 12:29 PM  Result Value Ref Range   Preg Test, Ur POSITIVE (A) NEGATIVE  CBC     Status: None   Collection Time: 08/16/14 12:47 PM  Result Value Ref Range   WBC 7.8 4.0 - 10.5 K/uL   RBC 4.16 3.87 - 5.11 MIL/uL   Hemoglobin 12.5 12.0 - 15.0 g/dL   HCT 40.136.6 02.736.0 - 25.346.0 %   MCV 88.0 78.0 - 100.0 fL   MCH 30.0 26.0 - 34.0 pg   MCHC 34.2 30.0 - 36.0 g/dL   RDW 66.414.9 40.311.5 - 47.415.5 %   Platelets 287 150 - 400 K/uL  hCG, quantitative, pregnancy     Status: Abnormal   Collection Time: 08/16/14 12:47 PM  Result Value Ref Range   hCG, Beta Chain, Quant, S 13302 (H) <5 mIU/mL  Wet prep, genital     Status: Abnormal   Collection Time: 08/16/14  1:23 PM  Result Value Ref Range   Yeast Wet Prep HPF POC NONE SEEN NONE SEEN   Trich, Wet Prep NONE SEEN NONE SEEN   Clue Cells Wet Prep HPF POC NONE SEEN NONE SEEN   WBC, Wet Prep HPF POC FEW (A) NONE SEEN   Ultrasound: FINDINGS: Intrauterine gestational sac: Visualized/normal in shape.  Yolk sac: Present  Embryo: Present  Cardiac Activity: Present  Heart Rate: 88 bpm  CRL: 2.5 mm 5 w 6 d US EDC: 04/12/2015  Maternal uterus/adnexae: No subchorionic hemorrhage.  Right ovary is within normal limits, measuring 3.0 x 2.3 x 2.7 cm.  Left ovary is within normal limits, measuring 2.1 x 2.1 x 2.3 cm, noting a corpus luteal cyst.  No free fluid.  IMPRESSION: Single live intrauterine gestation with estimated gestational age [redacted] weeks 6 days by crown-rump length.  Assessment and Plan  33 yo G3P0020 at 2942w6d wks IUP Abdominal Pain in Pregnancy  Plan: GC/CT pending Begin prenatal care Early pregnancy precautions  Cassandra Mann, [facility]ALIDAH N 08/16/2014, 12:49 PM

## 2014-08-16 NOTE — MAU Note (Signed)
Patient states she had a positive home pregnancy test last week. Has had left lower abdominal cramping since 10-29. Has nausea, no vomiting. Denies bleeding or discharge.

## 2014-08-16 NOTE — MAU Note (Signed)
Cramping started last wk, no better, not worse.   Is in lower abd, more to the left.  Feels like period is going to start. No bleeding.

## 2014-08-17 LAB — GC/CHLAMYDIA PROBE AMP
CT Probe RNA: NEGATIVE
GC PROBE AMP APTIMA: NEGATIVE

## 2014-08-23 ENCOUNTER — Ambulatory Visit (INDEPENDENT_AMBULATORY_CARE_PROVIDER_SITE_OTHER): Payer: 59 | Admitting: Family Medicine

## 2014-08-23 ENCOUNTER — Encounter: Payer: Self-pay | Admitting: Family Medicine

## 2014-08-23 VITALS — BP 106/70 | HR 81 | Temp 98.6°F | Ht 66.5 in | Wt 186.0 lb

## 2014-08-23 DIAGNOSIS — O219 Vomiting of pregnancy, unspecified: Secondary | ICD-10-CM

## 2014-08-23 DIAGNOSIS — N912 Amenorrhea, unspecified: Secondary | ICD-10-CM

## 2014-08-23 MED ORDER — DOXYLAMINE-PYRIDOXINE 10-10 MG PO TBEC: 1.0000 | DELAYED_RELEASE_TABLET | Freq: Three times a day (TID) | ORAL | Status: DC

## 2014-08-23 MED ORDER — PROMETHAZINE HCL 25 MG PO TABS: 25.0000 mg | ORAL_TABLET | Freq: Four times a day (QID) | ORAL | Status: DC | PRN

## 2014-08-23 NOTE — Progress Notes (Signed)
    Subjective:    Patient ID: Cassandra Mann is a 33 y.o. female presenting with Nausea  on 08/23/2014  HPI: She is 6 wks.  Baby number 3.  Severe N/V.  2 # weight loss in several days. Has tried eating before rising.  Not getting better.  Has had to miss work.  Review of Systems  Constitutional: Negative for fever and chills.  Respiratory: Negative for shortness of breath.   Cardiovascular: Negative for chest pain.  Gastrointestinal: Positive for nausea and vomiting. Negative for abdominal pain, diarrhea and constipation.  Genitourinary: Negative for dysuria.  Skin: Negative for rash.      Objective:    BP 106/70 mmHg  Pulse 81  Temp(Src) 98.6 F (37 C) (Oral)  Ht 5' 6.5" (1.689 m)  Wt 186 lb (84.369 kg)  BMI 29.57 kg/m2  LMP 07/06/2014 Physical Exam  Constitutional: She is oriented to person, place, and time. She appears well-developed and well-nourished. She appears ill. No distress.  HENT:  Head: Normocephalic and atraumatic.  Eyes: No scleral icterus.  Neck: Neck supple.  Cardiovascular: Normal rate.   Pulmonary/Chest: Effort normal.  Abdominal: Soft.  Neurological: She is alert and oriented to person, place, and time.  Skin: Skin is warm and dry.  Psychiatric: She has a normal mood and affect.        Assessment & Plan:   Amenorrhea - Plan: POCT urine pregnancy  Nausea and vomiting of pregnancy, antepartum - Plan: Doxylamine-Pyridoxine (DICLEGIS) 10-10 MG TBEC, promethazine (PHENERGAN) 25 MG tablet  Advised Diclegis first, then phenergan prn.  Return in about 3 months (around 11/23/2014), or if symptoms worsen or fail to improve.

## 2014-08-23 NOTE — Patient Instructions (Signed)

## 2014-08-31 LAB — OB RESULTS CONSOLE ANTIBODY SCREEN: ANTIBODY SCREEN: NEGATIVE

## 2014-08-31 LAB — OB RESULTS CONSOLE ABO/RH: RH TYPE: POSITIVE

## 2014-08-31 LAB — OB RESULTS CONSOLE RPR: RPR: NONREACTIVE

## 2014-08-31 LAB — OB RESULTS CONSOLE GC/CHLAMYDIA
Chlamydia: NEGATIVE
GC PROBE AMP, GENITAL: NEGATIVE

## 2014-08-31 LAB — OB RESULTS CONSOLE RUBELLA ANTIBODY, IGM: Rubella: IMMUNE

## 2014-08-31 LAB — OB RESULTS CONSOLE HIV ANTIBODY (ROUTINE TESTING): HIV: NONREACTIVE

## 2014-08-31 LAB — OB RESULTS CONSOLE HEPATITIS B SURFACE ANTIGEN: Hepatitis B Surface Ag: NEGATIVE

## 2014-09-07 ENCOUNTER — Inpatient Hospital Stay (HOSPITAL_COMMUNITY)
Admission: AD | Admit: 2014-09-07 | Discharge: 2014-09-07 | Disposition: A | Discharge status: Home / Self Care | Payer: 59 | Source: Ambulatory Visit | Attending: Obstetrics and Gynecology | Admitting: Obstetrics and Gynecology

## 2014-09-07 ENCOUNTER — Encounter (HOSPITAL_COMMUNITY): Payer: Self-pay | Admitting: *Deleted

## 2014-09-07 DIAGNOSIS — O218 Other vomiting complicating pregnancy: Secondary | ICD-10-CM | POA: Insufficient documentation

## 2014-09-07 DIAGNOSIS — O219 Vomiting of pregnancy, unspecified: Secondary | ICD-10-CM | POA: Diagnosis present

## 2014-09-07 DIAGNOSIS — Z3A09 9 weeks gestation of pregnancy: Secondary | ICD-10-CM | POA: Insufficient documentation

## 2014-09-07 DIAGNOSIS — O26899 Other specified pregnancy related conditions, unspecified trimester: Secondary | ICD-10-CM

## 2014-09-07 DIAGNOSIS — R109 Unspecified abdominal pain: Secondary | ICD-10-CM

## 2014-09-07 DIAGNOSIS — O99331 Smoking (tobacco) complicating pregnancy, first trimester: Secondary | ICD-10-CM | POA: Insufficient documentation

## 2014-09-07 LAB — T3: T3 TOTAL: 153.6 ng/dL (ref 80.0–204.0)

## 2014-09-07 LAB — TSH: TSH: 0.571 u[IU]/mL (ref 0.350–4.500)

## 2014-09-07 LAB — T4, FREE: Free T4: 0.94 ng/dL (ref 0.80–1.80)

## 2014-09-07 MED ORDER — M.V.I. ADULT IV INJ
Freq: Once | INTRAVENOUS | Status: AC
  Administered 2014-09-07: 13:00:00 via INTRAVENOUS
  Filled 2014-09-07: qty 10

## 2014-09-07 MED ORDER — DEXTROSE 5 % IN LACTATED RINGERS IV BOLUS
1000.0000 mL | Freq: Once | INTRAVENOUS | Status: AC
  Administered 2014-09-07: 1000 mL via INTRAVENOUS

## 2014-09-07 MED ORDER — ACETAMINOPHEN 500 MG PO TABS
1000.0000 mg | ORAL_TABLET | Freq: Four times a day (QID) | ORAL | Status: DC | PRN
  Administered 2014-09-07: 1000 mg via ORAL
  Filled 2014-09-07: qty 2

## 2014-09-07 MED ORDER — ONDANSETRON 8 MG/NS 50 ML IVPB
8.0000 mg | Freq: Once | INTRAVENOUS | Status: AC
  Administered 2014-09-07: 8 mg via INTRAVENOUS
  Filled 2014-09-07: qty 8

## 2014-09-07 NOTE — MAU Note (Signed)
Patient states she was seen in the office today and sent to MAU for hydration for nausea and vomiting.

## 2014-09-07 NOTE — MAU Provider Note (Signed)
History     CSN: [id number]  Arrival date and time: 09/07/14 1048    Chief Complaint  Patient presents with  . Hyperemesis Gravidarum   HPI Comments: [id number] @9wks  sent from office for N/V. Reports worsening over last few days. Tolerating some po liquids/solids. No fever. No VB. Some mild LLQ cramping x2 wks. Pregnancy complicated by hx of heart murmur and tobacco use.   OB History    Gravida Para Term Preterm AB TAB SAB Ectopic Multiple Living   3    2 1 1          Past Medical History  Diagnosis Date  . Heart murmur   . Headache   . Infection     UTI    Past Surgical History  Procedure Laterality Date  . Cardiac surgery      When she was 89 or 33 years old    Family History  Problem Relation Age of Onset  . Hypertension Mother   . Diabetes Father   . Hypertension Father   . Hyperlipidemia Father   . Alcohol abuse Maternal Grandmother     scirrosis  . Hypertension Maternal Grandmother   . Diabetes Maternal Grandmother   . Hyperlipidemia Maternal Grandmother   . Heart disease Maternal Grandmother   . Cancer Paternal Grandmother     ovarian  . Hearing loss Neg Hx     History  Substance Use Topics  . Smoking status: Current Some Day Smoker -- 0.25 packs/day for 5 years    Types: Cigarettes  . Smokeless tobacco: Never Used     Comment: working on quitting  . Alcohol Use: No    Allergies:  Allergies  Allergen Reactions  . Latex Itching  . Naproxen Palpitations  . Tramadol Palpitations    Also gives her migraines     Prescriptions prior to admission  Medication Sig Dispense Refill Last Dose  . Doxylamine-Pyridoxine (DICLEGIS) 10-10 MG TBEC Take 1 tablet by mouth 3 (three) times daily. 90 tablet 3   . promethazine (PHENERGAN) 25 MG tablet Take 1 tablet (25 mg total) by mouth every 6 (six) hours as needed for nausea. 42 tablet 2     Review of Systems  Constitutional: Negative.   HENT: Negative.   Eyes: Negative.   Respiratory: Negative.    Cardiovascular: Negative.   Gastrointestinal: Positive for nausea, vomiting and abdominal pain.  Skin: Negative.   Neurological: Negative.   Endo/Heme/Allergies: Negative.   Psychiatric/Behavioral: Negative.    Physical Exam   Blood pressure 110/63, pulse 69, temperature 97.8 F (36.6 C), temperature source Oral, resp. rate 18, height 5\' 5"  (1.651 m), weight 85.639 kg (188 lb 12.8 oz), last menstrual period 07/06/2014, unknown if currently breastfeeding.  Physical Exam  Constitutional: She is oriented to person, place, and time. She appears well-developed and well-nourished.  HENT:  Head: Normocephalic and atraumatic.  Eyes: Pupils are equal, round, and reactive to light.  Neck: Normal range of motion. Neck supple.  Cardiovascular: Normal rate.   Respiratory: Effort normal.  GI: Soft. She exhibits no distension and no mass. There is no tenderness. There is no rebound and no guarding.  Genitourinary:  deferred  Musculoskeletal: Normal range of motion.  Neurological: She is alert and oriented to person, place, and time.  Skin: Skin is warm and dry.  Psychiatric: She has a normal mood and affect.    MAU Course  Procedures IVF bolus Zofran IV IVF w/MTV Po challenge-tolerated w/o nausea or emesis  MDM n/a  Assessment and Plan  [redacted] weeks gestation Nausea and vomiting  Discharge home Start Zantac 1 po daily Continue Diclegis 2 po at hs Continue Zofran 4 mg q8 hrs-use consistently over next 2 days then prn Continue Phenergan 25 mg po q6 hrs prn Follow up in 3 wks as scheduled or for worsening sx  Daizy Outen, N 09/07/2014, 12:31 PM

## 2014-09-07 NOTE — Discharge Instructions (Signed)
Hyperemesis Gravidarum °Hyperemesis gravidarum is a severe form of nausea and vomiting that happens during pregnancy. Hyperemesis is worse than morning sickness. It may cause you to have nausea or vomiting all day for many days. It may keep you from eating and drinking enough food and liquids. Hyperemesis usually occurs during the first half (the first 20 weeks) of pregnancy. It often goes away once a woman is in her second half of pregnancy. However, sometimes hyperemesis continues through an entire pregnancy.  °CAUSES  °The cause of this condition is not completely known but is thought to be related to changes in the body's hormones when pregnant. It could be from the high level of the pregnancy hormone or an increase in estrogen in the body.  °SIGNS AND SYMPTOMS  °· Severe nausea and vomiting. °· Nausea that does not go away. °· Vomiting that does not allow you to keep any food down. °· Weight loss and body fluid loss (dehydration). °· Having no desire to eat or not liking food you have previously enjoyed. °DIAGNOSIS  °Your health care provider will do a physical exam and ask you about your symptoms. He or she may also order blood tests and urine tests to make sure something else is not causing the problem.  °TREATMENT  °You may only need medicine to control the problem. If medicines do not control the nausea and vomiting, you will be treated in the hospital to prevent dehydration, increased acid in the blood (acidosis), weight loss, and changes in the electrolytes in your body that may harm the unborn baby (fetus). You may need IV fluids.  °HOME CARE INSTRUCTIONS  °· Only take over-the-counter or prescription medicines as directed by your health care provider. °· Try eating a couple of dry crackers or toast in the morning before getting out of bed. °· Avoid foods and smells that upset your stomach. °· Avoid fatty and spicy foods. °· Eat 5-6 small meals a day. °· Do not drink when eating meals. Drink between  meals. °· For snacks, eat high-protein foods, such as cheese. °· Eat or suck on things that have ginger in them. Ginger helps nausea. °· Avoid food preparation. The smell of food can spoil your appetite. °· Avoid iron pills and iron in your multivitamins until after 3-4 months of being pregnant. However, consult with your health care provider before stopping any prescribed iron pills. °SEEK MEDICAL CARE IF:  °· Your abdominal pain increases. °· You have a severe headache. °· You have vision problems. °· You are losing weight. °SEEK IMMEDIATE MEDICAL CARE IF:  °· You are unable to keep fluids down. °· You vomit blood. °· You have constant nausea and vomiting. °· You have excessive weakness. °· You have extreme thirst. °· You have dizziness or fainting. °· You have a fever or persistent symptoms for more than 2-3 days. °· You have a fever and your symptoms suddenly get worse. °MAKE SURE YOU:  °· Understand these instructions. °· Will watch your condition. °· Will get help right away if you are not doing well or get worse. °Document Released: 09/30/2005 Document Revised: 07/21/2013 Document Reviewed: 05/12/2013 °[employer]® Patient Information ©2015 [employer]. This information is not intended to replace advice given to you by your health care provider. Make sure you discuss any questions you have with your health care provider. ° °

## 2014-09-07 NOTE — MAU Note (Signed)
Pt states she is nauseated all day every day, not vomiting every day, denies diarrhea.  Does have LLQ cramping, no bleeding.

## 2014-10-14 NOTE — L&D Delivery Note (Signed)
Delivery Note At 7:59 AM a viable and healthy female was delivered via Vaginal, Spontaneous Delivery (Presentation: Right Occiput Anterior).  APGAR: 8, 9; weight 6 lb 13 oz (3090 g).   Placenta status: , Spontaneous. Intact not sent  Cord: 3 vessels with the following complications: .  Cord pH: none  Anesthesia: Epidural  Episiotomy: None Lacerations: 1st degree;Perineal, subclitoral laceration Suture Repair: 3.0 chromic Est. Blood Loss (mL):    Mom to postpartum.  Baby to Couplet care / Skin to Skin.  Alazae Crymes A 04/10/2015, 9:51 AM

## 2014-12-20 IMAGING — US US OB COMP LESS 14 WK
1 series · 14 of 28 positions shown · non-contrast
Comparison: None.

CLINICAL DATA: Pregnant, left pelvic pain x5 days

EXAM:
OBSTETRIC <14 WK US AND TRANSVAGINAL OB US
TECHNIQUE: Both transabdominal and transvaginal ultrasound examinations were
performed for complete evaluation of the gestation as well as the
maternal uterus, adnexal regions, and pelvic cul-de-sac.
Transvaginal technique was performed to assess early pregnancy.

[Series 1: us ob comp less 14 wks · 14 of 32 slices shown]
[im 2/32]
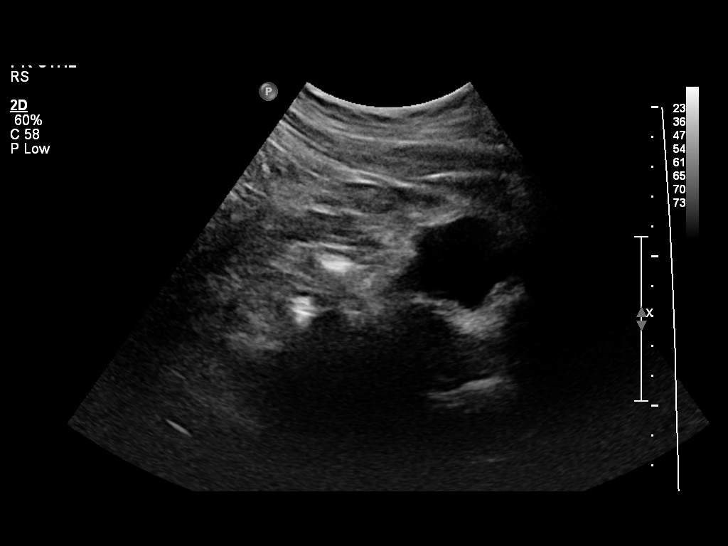
[im 4/32]
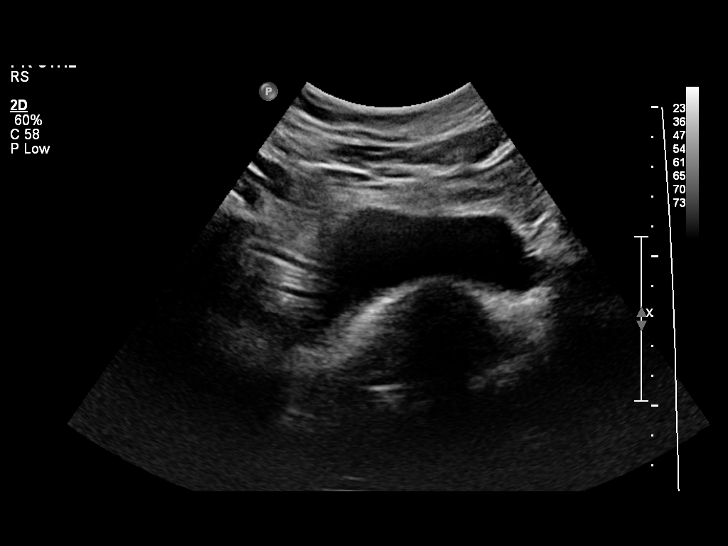
[im 6/32]
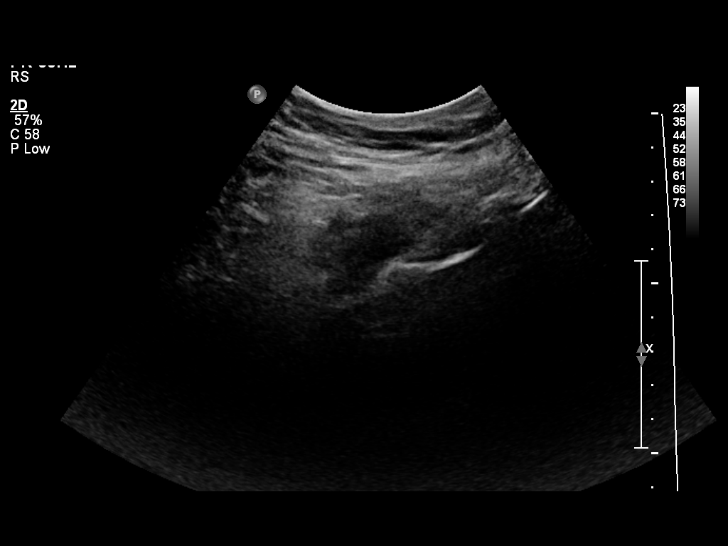
[im 9/32]
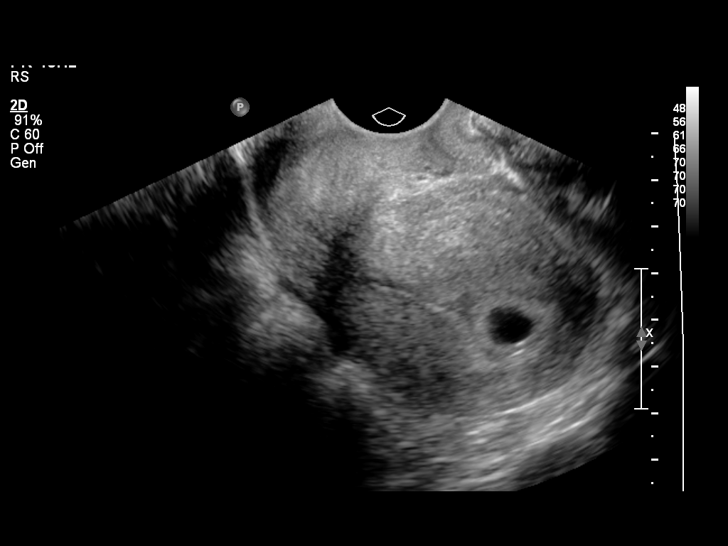
[im 11/32]
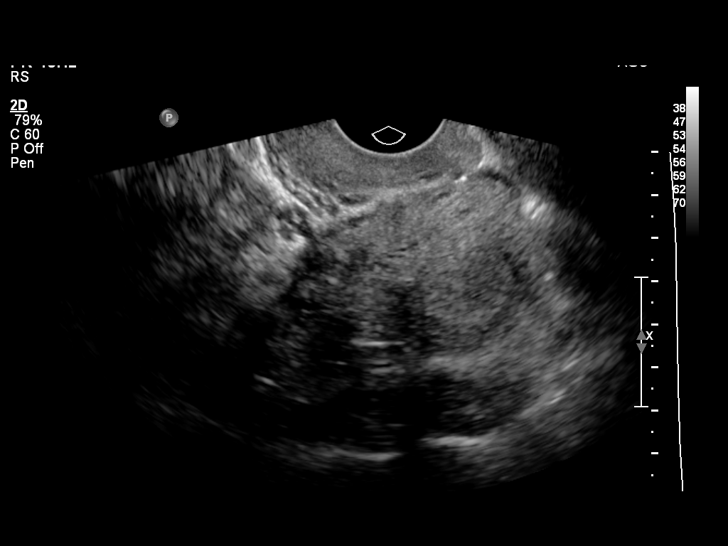
[im 13/32]
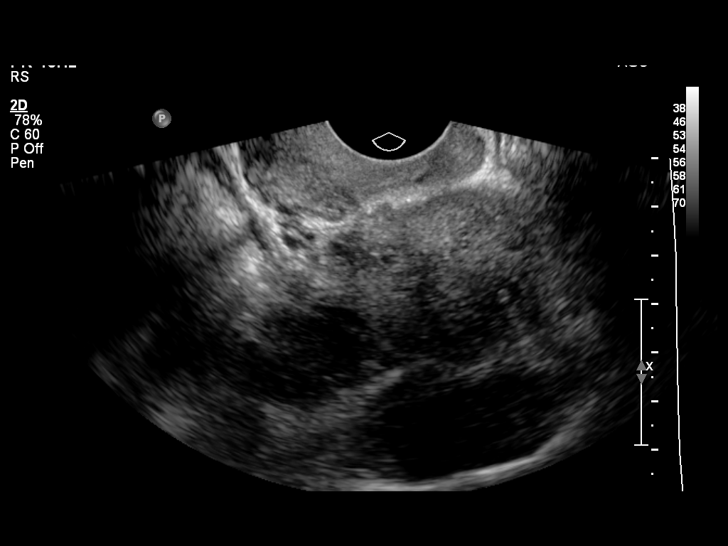
[im 15/32]
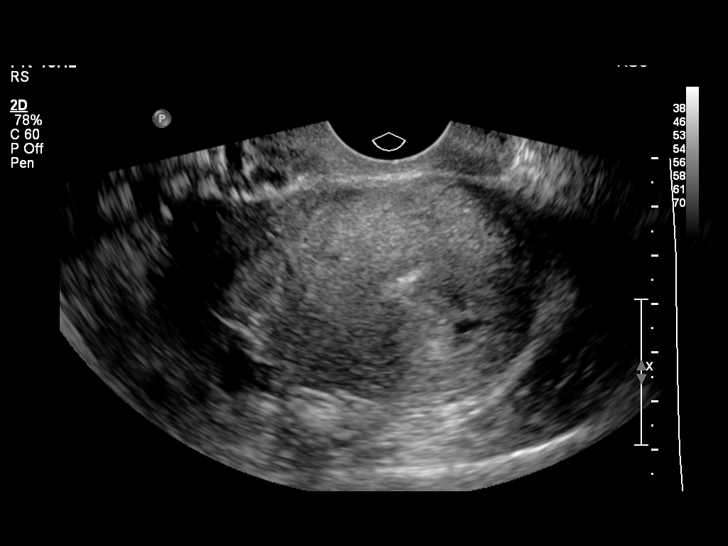
[im 18/32]
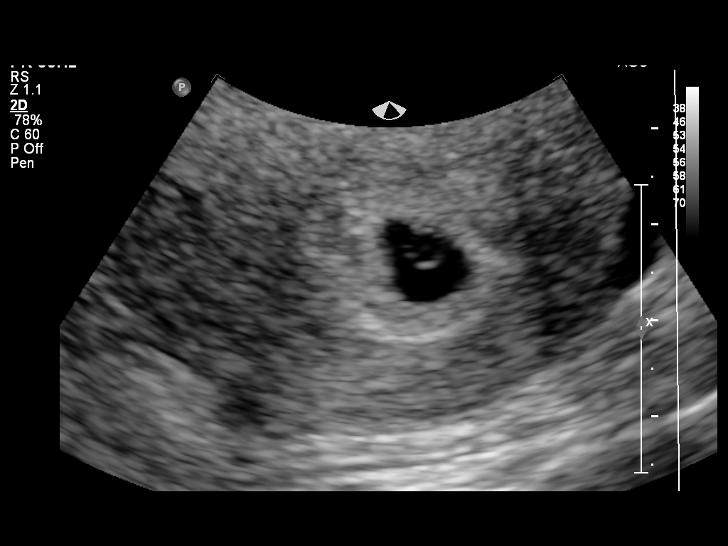
[im 20/32]
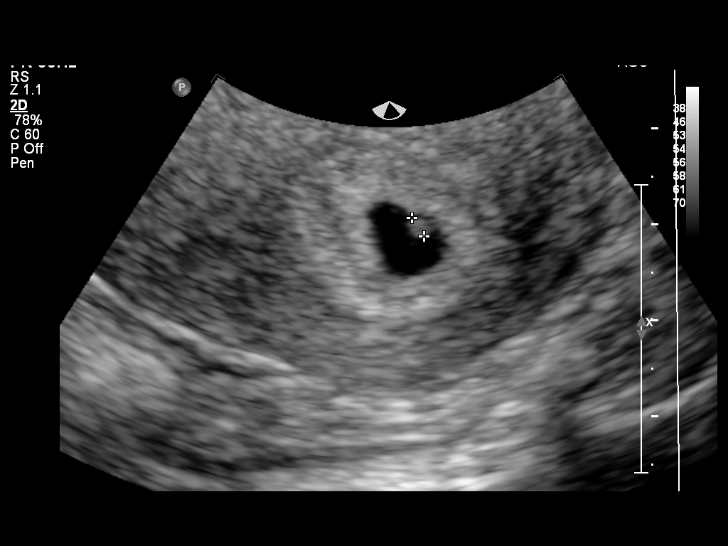
[im 22/32]
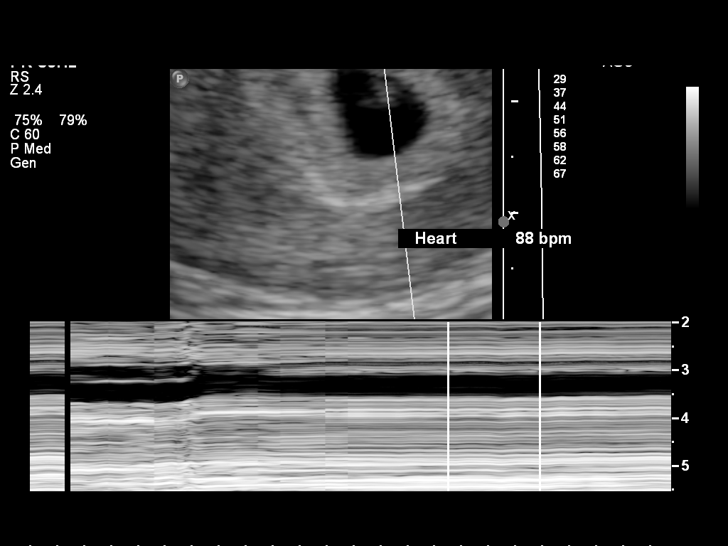
[im 25/32]
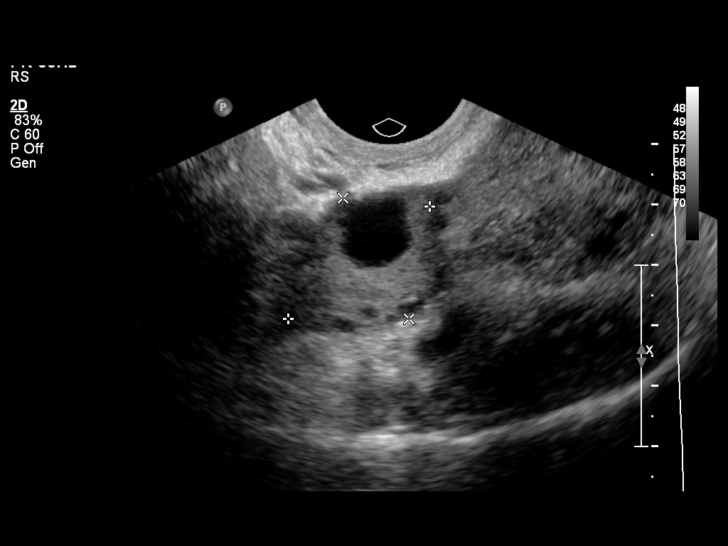
[im 27/32]
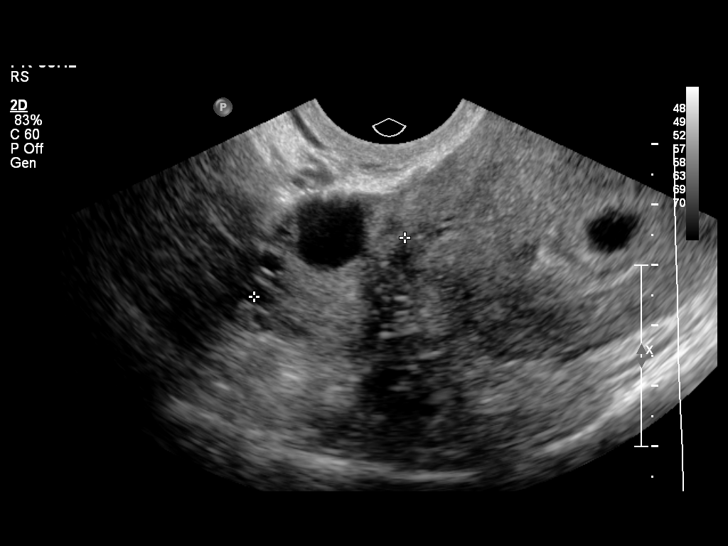
[im 29/32]
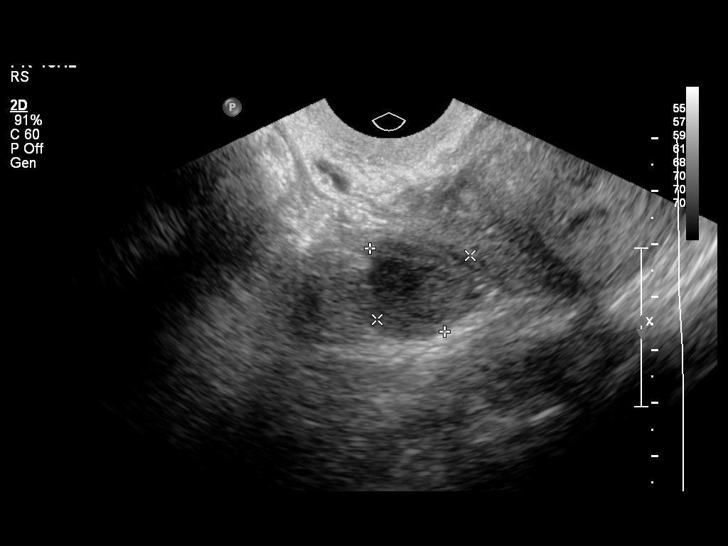
[im 32/32]
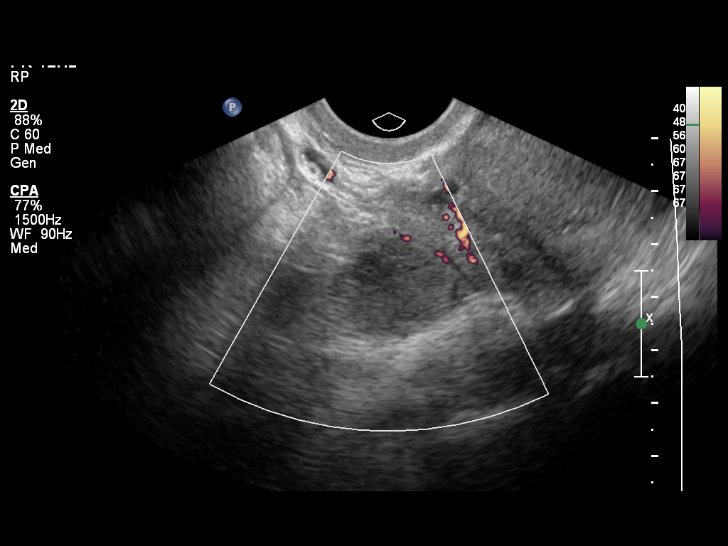

[14 of 28 positions shown; findings below may reference images not displayed]

FINDINGS: Intrauterine gestational sac: Visualized/normal in shape.

Yolk sac:  Present

Embryo:  Present

Cardiac Activity: Present

Heart Rate:  88 bpm

CRL:   2.5  mm   5 w 6 d                  US EDC: 04/12/2015

Maternal uterus/adnexae: No subchorionic hemorrhage.

Right ovary is within normal limits, measuring 3.0 x 2.3 x 2.7 cm.

Left ovary is within normal limits, measuring 2.1 x 2.1 x 2.3 cm,
noting a corpus luteal cyst.

No free fluid.
IMPRESSION: Single live intrauterine gestation with estimated gestational age 5
weeks 6 days by crown-rump length.

## 2015-02-15 ENCOUNTER — Encounter: Payer: 59 | Attending: Obstetrics and Gynecology

## 2015-02-15 VITALS — Ht 65.0 in | Wt 218.3 lb

## 2015-02-15 DIAGNOSIS — O24419 Gestational diabetes mellitus in pregnancy, unspecified control: Secondary | ICD-10-CM | POA: Diagnosis present

## 2015-02-15 DIAGNOSIS — Z713 Dietary counseling and surveillance: Secondary | ICD-10-CM | POA: Insufficient documentation

## 2015-02-16 NOTE — Progress Notes (Signed)
  Patient was seen on 02/15/15 for Gestational Diabetes self-management . The following learning objectives were met by the patient :   States the definition of Gestational Diabetes  States why dietary management is important in controlling blood glucose  Describes the effects of carbohydrates on blood glucose levels  Demonstrates ability to create a balanced meal plan  Demonstrates carbohydrate counting   States when to check blood glucose levels  Demonstrates proper blood glucose monitoring techniques  States the effect of stress and exercise on blood glucose levels  States the importance of limiting caffeine and abstaining from alcohol and smoking  Plan:  Aim for 2 Carb Choices per meal (30 grams) +/- 1 either way for breakfast Aim for 3 Carb Choices per meal (45 grams) +/- 1 either way from lunch and dinner Aim for 1-2 Carbs per snack Begin reading food labels for Total Carbohydrate and sugar grams of foods Consider  increasing your activity level by walking daily as tolerated Begin checking BG before breakfast and 2 hours after first bit of breakfast, lunch and dinner after  as directed by MD  Take medication  as directed by MD  Blood glucose monitor given: One Touch Ultra Mini Lot # [id number] X Exp: 06/2015 Blood glucose reading: $RemoveBeforeDE'78mg'ubOnjFqHWGcXRGk$ /dl  Patient instructed to monitor glucose levels: FBS: 60 - <90 2 hour: <120  Patient received the following handouts:  Nutrition Diabetes and Pregnancy  Carbohydrate Counting List  Meal Planning worksheet  Patient will be seen for follow-up as needed.

## 2015-02-20 ENCOUNTER — Encounter (HOSPITAL_COMMUNITY): Payer: Self-pay

## 2015-02-20 ENCOUNTER — Inpatient Hospital Stay (HOSPITAL_COMMUNITY)
Admission: AD | Admit: 2015-02-20 | Discharge: 2015-02-20 | Disposition: A | Discharge status: Home / Self Care | Payer: 59 | Source: Ambulatory Visit | Attending: Obstetrics and Gynecology | Admitting: Obstetrics and Gynecology

## 2015-02-20 DIAGNOSIS — K219 Gastro-esophageal reflux disease without esophagitis: Secondary | ICD-10-CM | POA: Insufficient documentation

## 2015-02-20 DIAGNOSIS — O99613 Diseases of the digestive system complicating pregnancy, third trimester: Secondary | ICD-10-CM | POA: Insufficient documentation

## 2015-02-20 DIAGNOSIS — F17210 Nicotine dependence, cigarettes, uncomplicated: Secondary | ICD-10-CM | POA: Insufficient documentation

## 2015-02-20 DIAGNOSIS — R109 Unspecified abdominal pain: Secondary | ICD-10-CM | POA: Insufficient documentation

## 2015-02-20 DIAGNOSIS — Z3A32 32 weeks gestation of pregnancy: Secondary | ICD-10-CM | POA: Insufficient documentation

## 2015-02-20 DIAGNOSIS — O24419 Gestational diabetes mellitus in pregnancy, unspecified control: Secondary | ICD-10-CM | POA: Diagnosis not present

## 2015-02-20 DIAGNOSIS — R102 Pelvic and perineal pain: Secondary | ICD-10-CM

## 2015-02-20 DIAGNOSIS — O26899 Other specified pregnancy related conditions, unspecified trimester: Secondary | ICD-10-CM

## 2015-02-20 LAB — URINALYSIS, ROUTINE W REFLEX MICROSCOPIC
Bilirubin Urine: NEGATIVE
Glucose, UA: NEGATIVE mg/dL
Hgb urine dipstick: NEGATIVE
Ketones, ur: 15 mg/dL — AB
Leukocytes, UA: NEGATIVE
Nitrite: NEGATIVE
Protein, ur: NEGATIVE mg/dL
Specific Gravity, Urine: 1.015 (ref 1.005–1.030)
Urobilinogen, UA: 0.2 mg/dL (ref 0.0–1.0)
pH: 7 (ref 5.0–8.0)

## 2015-02-20 MED ORDER — RANITIDINE HCL 150 MG PO TABS: 150.0000 mg | ORAL_TABLET | Freq: Two times a day (BID) | ORAL | Status: DC

## 2015-02-20 MED ORDER — FAMOTIDINE 20 MG PO TABS
40.0000 mg | ORAL_TABLET | Freq: Once | ORAL | Status: AC
  Administered 2015-02-20: 40 mg via ORAL
  Filled 2015-02-20: qty 2

## 2015-02-20 MED ORDER — CEFUROXIME AXETIL 500 MG PO TABS
500.0000 mg | ORAL_TABLET | Freq: Once | ORAL | Status: AC
  Administered 2015-02-20: 500 mg via ORAL
  Filled 2015-02-20: qty 1

## 2015-02-20 MED ORDER — CEFUROXIME AXETIL 500 MG PO TABS: 500.0000 mg | ORAL_TABLET | Freq: Two times a day (BID) | ORAL | Status: DC

## 2015-02-20 NOTE — MAU Provider Note (Signed)
History     CSN: [id number]  Arrival date and time: 02/20/15 1929 Nurse call to provider @ 2018 Provider here to see patient @ 2120   Chief Complaint  Patient presents with  . Abdominal Cramping  . Nausea   HPI Reports nausea all day FBS 106 this am - not checked BS rest of day Nausea and malaise all AM so left work ~ 11am and went home to sleep Nausea persisted thru afternoon - cramping and abdominal pain in sides since 1600 Pain in abdomen and sides worse with FM  / radiates in to groin with sharp stabbing pain in vagina Reports increased urination - frequency / nocturia                denies urgency / dysuria Very active FM No bleeding or spotting   Past Medical History  Diagnosis Date  . Heart murmur   . Headache   . Infection     UTI  . Gestational diabetes mellitus, antepartum    Past Surgical History  Procedure Laterality Date  . Cardiac surgery      When she was 34 years old old    Family History  Problem Relation Age of Onset  . Hypertension Mother   . Diabetes Father   . Hypertension Father   . Hyperlipidemia Father   . Alcohol abuse Maternal Grandmother     scirrosis  . Hypertension Maternal Grandmother   . Diabetes Maternal Grandmother   . Hyperlipidemia Maternal Grandmother   . Heart disease Maternal Grandmother   . Cancer Paternal Grandmother     ovarian  . Hearing loss Neg Hx     History  Substance Use Topics  . Smoking status: Current Some Day Smoker -- 0.25 packs/day for 5 years    Types: Cigarettes  . Smokeless tobacco: Never Used     Comment: working on quitting  . Alcohol Use: No    Allergies:  Allergies  Allergen Reactions  . Latex Itching  . Naproxen Palpitations  . Tramadol Palpitations    Also gives her migraines     Prescriptions prior to admission  Medication Sig Dispense Refill Last Dose  . Prenatal Vit-Fe Fumarate-FA (PRENATAL MULTIVITAMIN) TABS tablet Take 1 tablet by mouth daily at 12 noon.   02/19/2015 at Unknown  time  . Doxylamine-Pyridoxine (DICLEGIS) 10-10 MG TBEC Take 1 tablet by mouth 3 (three) times daily. (Patient not taking: Reported on 02/20/2015) 90 tablet 3   . ondansetron (ZOFRAN) 8 MG tablet Take 8 mg by mouth every 8 (eight) hours as needed for nausea or vomiting.   more than one month  . promethazine (PHENERGAN) 25 MG tablet Take 1 tablet (25 mg total) by mouth every 6 (six) hours as needed for nausea. (Patient not taking: Reported on 02/20/2015) 42 tablet 2 Not Taking at Unknown time    ROS  Nausea / no vomiting No diarrhea / no BM since yesterday FM (+)  / causes abdominal side pain and vaginal stabbing pains Pressure on bladder with frequent urination and nocturia No bleeding or vaginal discharge No recent sex  Physical Exam   Blood pressure 110/70, pulse 103, temperature 97.9 F (36.6 C), temperature source Oral, resp. rate 20, height 5\' 5"  (1.651 m), weight 100.154 kg (220 lb 12.8 oz), last menstrual period 07/06/2014, SpO2 100 %, not currently breastfeeding.  Physical Exam Alert and oriented / NAD / moaning with movements & position changes & audible FM per monitor Abdomen soft and non-tender /  no epigastric pain Fundus non-tender / Leopold's ? transverse lie Cervix closed and long / no LUSD or presenting part in pelvis Marked suprapubic tenderness and bladder tenderness on exam Bedside informal sono - singleton / right transverse / oblique lie / active FM / + cardiac activity / subjectively normal AF  Urinalysis: 15 ketone / Spec Gravity 1015 MAU Course  Procedures NST    - reactive 135 baseline / + accels / no decels     - no ctx or UI Assessment and Plan  32.5 weeks Abdominal pain lower bilateral radiates into groin - likely MS & ligament pain from fetal position oblique/transverse lie No evidence of PTL GDMa1 - non-compliant with diet recommendation GERD with persistent nausea - OTC PRN ineffective  1) send urine culture  2) start presumptive ABX Ceftin 500mg  BID  x 3 days pending culture results - planditional ABX if culture (+) 3) Pepcid 40mg  load dose now - recommend DAILY Zantac 150 BID for GERD control 4) Dietary: small frequent low carb meals - every 3-4 hours with adequate water intake daily of 4 bottles of water 5) rest at home tonight and tomorrow (note for OOW given) 6) PTL and UTI precautions to call: regular ctx / bleeding / LOF, fever and chills / unilateral intense back pain / hematuria 7) OV this week for follow-up - call tomorrow to schedule apt Wed or Thurs with Dr Darvin Neighboursousins  Norlene Lanes, Kenney HousemanANYA 02/20/2015, 9:13 PM

## 2015-02-20 NOTE — MAU Note (Signed)
Lower abdominal cramping since 4 pm today. Denies vaginal bleeding or LOF. Positive fetal movement. Nausea since this morning, no vomiting.

## 2015-02-21 LAB — CULTURE, OB URINE
Colony Count: 7000
Special Requests: NORMAL

## 2015-03-08 LAB — OB RESULTS CONSOLE GBS: GBS: NEGATIVE

## 2015-03-31 ENCOUNTER — Telehealth (HOSPITAL_COMMUNITY): Payer: Self-pay | Admitting: *Deleted

## 2015-03-31 ENCOUNTER — Encounter (HOSPITAL_COMMUNITY): Payer: Self-pay | Admitting: *Deleted

## 2015-03-31 NOTE — Telephone Encounter (Signed)
Preadmission screen  

## 2015-04-07 ENCOUNTER — Other Ambulatory Visit: Payer: Self-pay | Admitting: Obstetrics and Gynecology

## 2015-04-09 ENCOUNTER — Inpatient Hospital Stay (HOSPITAL_COMMUNITY): Payer: 59 | Admitting: Anesthesiology

## 2015-04-09 ENCOUNTER — Encounter (HOSPITAL_COMMUNITY): Payer: Self-pay

## 2015-04-09 ENCOUNTER — Inpatient Hospital Stay (HOSPITAL_COMMUNITY)
Admission: RE | Admit: 2015-04-09 | Discharge: 2015-04-12 | DRG: 775 | Disposition: A | Discharge status: Home / Self Care | Payer: 59 | Source: Ambulatory Visit | Attending: Obstetrics and Gynecology | Admitting: Obstetrics and Gynecology

## 2015-04-09 DIAGNOSIS — O24410 Gestational diabetes mellitus in pregnancy, diet controlled: Secondary | ICD-10-CM | POA: Diagnosis present

## 2015-04-09 DIAGNOSIS — Z9119 Patient's noncompliance with other medical treatment and regimen: Secondary | ICD-10-CM | POA: Diagnosis present

## 2015-04-09 DIAGNOSIS — O9081 Anemia of the puerperium: Secondary | ICD-10-CM | POA: Diagnosis not present

## 2015-04-09 DIAGNOSIS — O24420 Gestational diabetes mellitus in childbirth, diet controlled: Principal | ICD-10-CM | POA: Diagnosis present

## 2015-04-09 DIAGNOSIS — Z87891 Personal history of nicotine dependence: Secondary | ICD-10-CM

## 2015-04-09 DIAGNOSIS — Z3A39 39 weeks gestation of pregnancy: Secondary | ICD-10-CM | POA: Diagnosis present

## 2015-04-09 DIAGNOSIS — D62 Acute posthemorrhagic anemia: Secondary | ICD-10-CM | POA: Diagnosis not present

## 2015-04-09 HISTORY — DX: Personal history of nicotine dependence: Z87.891

## 2015-04-09 LAB — TYPE AND SCREEN
ABO/RH(D): O POS
Antibody Screen: NEGATIVE

## 2015-04-09 LAB — GLUCOSE, CAPILLARY
GLUCOSE-CAPILLARY: 93 mg/dL (ref 65–99)
GLUCOSE-CAPILLARY: 84 mg/dL (ref 65–99)
GLUCOSE-CAPILLARY: 74 mg/dL (ref 65–99)
GLUCOSE-CAPILLARY: 69 mg/dL (ref 65–99)
Glucose-Capillary: 83 mg/dL (ref 65–99)
Glucose-Capillary: 68 mg/dL (ref 65–99)
Glucose-Capillary: 67 mg/dL (ref 65–99)

## 2015-04-09 LAB — CBC
HEMATOCRIT: 35.1 % — AB (ref 36.0–46.0)
HEMOGLOBIN: 12.3 g/dL (ref 12.0–15.0)
MCH: 30.5 pg (ref 26.0–34.0)
MCHC: 35 g/dL (ref 30.0–36.0)
MCV: 87.1 fL (ref 78.0–100.0)
PLATELETS: 288 10*3/uL (ref 150–400)
RBC: 4.03 MIL/uL (ref 3.87–5.11)
RDW: 14.4 % (ref 11.5–15.5)
WBC: 8.7 10*3/uL (ref 4.0–10.5)

## 2015-04-09 LAB — RPR: RPR Ser Ql: NONREACTIVE

## 2015-04-09 LAB — GLUCOSE, RANDOM: GLUCOSE: 106 mg/dL — AB (ref 65–99)

## 2015-04-09 MED ORDER — FENTANYL 2.5 MCG/ML BUPIVACAINE 1/10 % EPIDURAL INFUSION (WH - ANES)
14.0000 mL/h | INTRAMUSCULAR | Status: DC | PRN
  Administered 2015-04-09 – 2015-04-10 (×3): 14 mL/h via EPIDURAL
  Filled 2015-04-09 (×2): qty 125

## 2015-04-09 MED ORDER — LACTATED RINGERS IV SOLN
INTRAVENOUS | Status: DC
  Administered 2015-04-09 (×4): via INTRAVENOUS

## 2015-04-09 MED ORDER — PHENYLEPHRINE 40 MCG/ML (10ML) SYRINGE FOR IV PUSH (FOR BLOOD PRESSURE SUPPORT)
PREFILLED_SYRINGE | INTRAVENOUS | Status: AC
  Filled 2015-04-09: qty 20

## 2015-04-09 MED ORDER — MISOPROSTOL 25 MCG QUARTER TABLET
25.0000 ug | ORAL_TABLET | ORAL | Status: DC | PRN
  Administered 2015-04-09 (×2): 25 ug via VAGINAL
  Filled 2015-04-09 (×2): qty 0.25
  Filled 2015-04-09: qty 1

## 2015-04-09 MED ORDER — OXYCODONE-ACETAMINOPHEN 5-325 MG PO TABS: 2.0000 | ORAL_TABLET | ORAL | Status: DC | PRN

## 2015-04-09 MED ORDER — TERBUTALINE SULFATE 1 MG/ML IJ SOLN: 0.2500 mg | Freq: Once | INTRAMUSCULAR | Status: AC | PRN

## 2015-04-09 MED ORDER — PHENYLEPHRINE 40 MCG/ML (10ML) SYRINGE FOR IV PUSH (FOR BLOOD PRESSURE SUPPORT)
80.0000 ug | PREFILLED_SYRINGE | INTRAVENOUS | Status: DC | PRN
  Filled 2015-04-09: qty 20
  Filled 2015-04-09: qty 2

## 2015-04-09 MED ORDER — ZOLPIDEM TARTRATE 5 MG PO TABS
5.0000 mg | ORAL_TABLET | Freq: Every evening | ORAL | Status: DC | PRN
  Administered 2015-04-09: 5 mg via ORAL
  Filled 2015-04-09: qty 1

## 2015-04-09 MED ORDER — OXYCODONE-ACETAMINOPHEN 5-325 MG PO TABS
1.0000 | ORAL_TABLET | ORAL | Status: DC | PRN
  Administered 2015-04-10: 1 via ORAL
  Filled 2015-04-09: qty 1

## 2015-04-09 MED ORDER — OXYTOCIN BOLUS FROM INFUSION: 500.0000 mL | INTRAVENOUS | Status: DC

## 2015-04-09 MED ORDER — BUTORPHANOL TARTRATE 2 MG/ML IJ SOLN
2.0000 mg | INTRAMUSCULAR | Status: DC | PRN
  Administered 2015-04-09: 2 mg via INTRAVENOUS
  Filled 2015-04-09: qty 2

## 2015-04-09 MED ORDER — ONDANSETRON HCL 4 MG/2ML IJ SOLN
4.0000 mg | Freq: Four times a day (QID) | INTRAMUSCULAR | Status: DC | PRN
  Administered 2015-04-09 (×2): 4 mg via INTRAVENOUS
  Filled 2015-04-09 (×3): qty 2

## 2015-04-09 MED ORDER — CITRIC ACID-SODIUM CITRATE 334-500 MG/5ML PO SOLN: 30.0000 mL | ORAL | Status: DC | PRN

## 2015-04-09 MED ORDER — FLEET ENEMA 7-19 GM/118ML RE ENEM: 1.0000 | ENEMA | RECTAL | Status: DC | PRN

## 2015-04-09 MED ORDER — DIPHENHYDRAMINE HCL 50 MG/ML IJ SOLN: 12.5000 mg | INTRAMUSCULAR | Status: DC | PRN

## 2015-04-09 MED ORDER — LIDOCAINE HCL (PF) 1 % IJ SOLN
30.0000 mL | INTRAMUSCULAR | Status: DC | PRN
  Administered 2015-04-10: 30 mL via SUBCUTANEOUS
  Filled 2015-04-09: qty 30

## 2015-04-09 MED ORDER — ACETAMINOPHEN 325 MG PO TABS: 650.0000 mg | ORAL_TABLET | ORAL | Status: DC | PRN

## 2015-04-09 MED ORDER — EPHEDRINE 5 MG/ML INJ
10.0000 mg | INTRAVENOUS | Status: DC | PRN
  Filled 2015-04-09: qty 2

## 2015-04-09 MED ORDER — LIDOCAINE HCL (PF) 1 % IJ SOLN
INTRAMUSCULAR | Status: DC | PRN
  Administered 2015-04-09: 5 mL
  Administered 2015-04-09: 3 mL
  Administered 2015-04-09: 5 mL

## 2015-04-09 MED ORDER — OXYTOCIN 40 UNITS IN LACTATED RINGERS INFUSION - SIMPLE MED: 62.5000 mL/h | INTRAVENOUS | Status: DC

## 2015-04-09 MED ORDER — OXYTOCIN 40 UNITS IN LACTATED RINGERS INFUSION - SIMPLE MED
1.0000 m[IU]/min | INTRAVENOUS | Status: DC
  Administered 2015-04-09: 2 m[IU]/min via INTRAVENOUS
  Filled 2015-04-09 (×2): qty 1000

## 2015-04-09 MED ORDER — LACTATED RINGERS IV SOLN: 500.0000 mL | INTRAVENOUS | Status: DC | PRN

## 2015-04-09 NOTE — Progress Notes (Signed)
S: c/o menstrual like cramps S/p cytotec x 2  O: pitocin Filed Vitals:   04/09/15 0102 04/09/15 0107 04/09/15 0605 04/09/15 1007  BP: 113/65  99/48 111/66  Pulse: 93  83 75  Temp: 97.6 F (36.4 C)  97.9 F (36.6 C) 98.2 F (36.8 C)  TempSrc: Oral  Oral Oral  Resp: 18  16 18   Height:  5\' 5"  (1.651 m)    Weight:  100.154 kg (220 lb 12.8 oz)     VE 1-2/70/-2  Tracing: baseline 125-130 Ctx q 5-7 mins  IMP: Class A1 GDM Term gestation P) may change to q 4 hrs BS testing. Cont with pitocin. Analgesic prn

## 2015-04-09 NOTE — Progress Notes (Signed)
Cassandra Mann is a 34 y.o. G3P0020 at [redacted]w[redacted]d by ultrasound admitted for induction of labor due to Gestational diabetes.  Subjective: No chief complaint on file. notes menstrual cramps Freq urination  Objective: BP 98/50 mmHg  Pulse 72  Temp(Src) 97.4 F (36.3 C) (Oral)  Resp 20  Ht 5\' 5"  (1.651 m)  Wt 100.154 kg (220 lb 12.8 oz)  BMI 36.74 kg/m2  LMP 07/06/2014      FHT:  FHR: 145 bpm, variability: moderate,  accelerations:  Present,  decelerations:  Absent UC:   regular, every 2-3 minutes SVE:   3 cm dilated, 60% effaced, -2tation Tracing: cat 1  Labs: Lab Results  Component Value Date   WBC 8.7 04/09/2015   HGB 12.3 04/09/2015   HCT 35.1* 04/09/2015   MCV 87.1 04/09/2015   PLT 288 04/09/2015  BS 68  Assessment / Plan: latent phase Class A1 GDM P) cont pitocin. Defer amniotomy  Anticipated MOD:  guarded  Tausha Milhoan A 04/09/2015, 6:25 PM

## 2015-04-09 NOTE — Anesthesia Procedure Notes (Signed)
Epidural Patient location during procedure: OB  Staffing Anesthesiologist: Glenetta Kiger Performed by: anesthesiologist   Preanesthetic Checklist Completed: patient identified, site marked, surgical consent, pre-op evaluation, timeout performed, IV checked, risks and benefits discussed and monitors and equipment checked  Epidural Patient position: sitting Prep: DuraPrep Patient monitoring: heart rate, continuous pulse ox and blood pressure Approach: midline Location: L3-L4 Injection technique: LOR saline  Needle:  Needle type: Tuohy  Needle gauge: 17 G Needle length: 9 cm and 9 Needle insertion depth: 7 cm Catheter type: closed end flexible Catheter size: 20 Guage Catheter at skin depth: 12 cm Test dose: negative  Assessment Events: blood not aspirated, injection not painful, no injection resistance, negative IV test and no paresthesia  Additional Notes Patient identified. Risks/Benefits/Options discussed with patient including but not limited to bleeding, infection, nerve damage, paralysis, failed block, incomplete pain control, headache, blood pressure changes, nausea, vomiting, reactions to medication both or allergic, itching and postpartum back pain. Confirmed with bedside nurse the patient's most recent platelet count. Confirmed with patient that they are not currently taking any anticoagulation, have any bleeding history or any family history of bleeding disorders. Patient expressed understanding and wished to proceed. All questions were answered. Sterile technique was used throughout the entire procedure. Please see nursing notes for vital signs. Test dose was given through epidural needle and negative prior to continuing to dose epidural or start infusion. Warning signs of high block given to the patient including shortness of breath, tingling/numbness in hands, complete motor block, or any concerning symptoms with instructions to call for help. Patient was given  instructions on fall risk and not to get out of bed. All questions and concerns addressed with instructions to call with any issues.   

## 2015-04-09 NOTE — H&P (Signed)
Cassandra Mann is a 34 y.o. female presenting for IOL 2nd to GDM. Intact membrane. GBS cx neg Maternal Medical History:  Reason for admission: Contractions.   Contractions: Frequency: irregular.    Fetal activity: Perceived fetal activity is normal.    Prenatal Complications - Diabetes: gestational. Diabetes is managed by diet.      OB History    Gravida Para Term Preterm AB TAB SAB Ectopic Multiple Living   3    2 1 1         Past Medical History  Diagnosis Date  . Heart murmur   . Headache   . Infection     UTI  . Gestational diabetes mellitus, antepartum   . Enlarged thyroid   . Gestational diabetes     dietcontrolled   Past Surgical History  Procedure Laterality Date  . Cardiac surgery      When she was 36 or 34 years old murmur   Family History: family history includes Alcohol abuse in her maternal grandmother; Cancer in her paternal grandmother; Diabetes in her father, maternal grandfather, maternal grandmother, paternal grandfather, and paternal grandmother; Heart disease in her maternal grandmother; Hyperlipidemia in her father and maternal grandmother; Hypertension in her father, maternal grandmother, and mother. There is no history of Hearing loss. Social History:  reports that she quit smoking about 5 months ago. Her smoking use included Cigarettes. She has a 1.25 pack-year smoking history. She has never used smokeless tobacco. She reports that she does not drink alcohol or use illicit drugs.   Prenatal Transfer Tool  Maternal Diabetes: Yes:  Diabetes Type:  Diet controlled Genetic Screening: Normal Maternal Ultrasounds/Referrals: Normal Fetal Ultrasounds or other Referrals:  Fetal echo nl Maternal Substance Abuse:  Yes:  Type: Other: former smoker Significant Maternal Medications:  None Significant Maternal Lab Results:  Lab values include: Group B Strep negative Other Comments:  noncompliant on BS testing  Review of Systems  All other systems reviewed and are  negative.   Dilation: 1.5 Effacement (%): 70 Station: -2 Exam by:: Foley,rn Blood pressure 111/66, pulse 75, temperature 98.2 F (36.8 C), temperature source Oral, resp. rate 18, height 5\' 5"  (1.651 m), weight 100.154 kg (220 lb 12.8 oz), last menstrual period 07/06/2014. Maternal Exam:  Abdomen: Estimated fetal weight is 7lb.   Fetal presentation: vertex  Introitus: Normal vulva. Cervix: Cervix evaluated by digital exam.     Physical Exam  Constitutional: She is oriented to person, place, and time. She appears well-developed and well-nourished.  Cardiovascular: Normal rate and regular rhythm.   Respiratory: Breath sounds normal.  GI: Soft.  Musculoskeletal: She exhibits no edema.  Neurological: She is alert and oriented to person, place, and time.  Skin: Skin is warm and dry.  Psychiatric: She has a normal mood and affect.    Prenatal labs: ABO, Rh: --/--/O POS (06/26 0115) Antibody: NEG (06/26 0115) Rubella: Immune (11/18 0000) RPR: Nonreactive (11/18 0000)  HBsAg: Negative (11/18 0000)  HIV: Non-reactive (11/18 0000)  GBS: Negative (05/25 0000)   Assessment/Plan: GDM Term gestation P) admit routine labs. BS q 2 hrs. cytotec for ripening. Amniotomy prn. IV pitocin as indicated  Cassandra Mann A 04/09/2015, 10:39 AM

## 2015-04-09 NOTE — Anesthesia Preprocedure Evaluation (Addendum)
Anesthesia Evaluation  Patient identified by MRN, date of birth, ID band Patient awake    Reviewed: Allergy & Precautions, H&P , NPO status , Patient's Chart, lab work & pertinent test results  History of Anesthesia Complications Negative for: history of anesthetic complications  Airway Mallampati: II  TM Distance: >3 FB Neck ROM: full    Dental no notable dental hx. (+) Teeth Intact   Pulmonary neg pulmonary ROS, former smoker,  breath sounds clear to auscultation  Pulmonary exam normal       Cardiovascular Normal cardiovascular examRhythm:regular Rate:Normal  PDA repair Age10   Neuro/Psych negative neurological ROS  negative psych ROS   GI/Hepatic negative GI ROS, Neg liver ROS,   Endo/Other  diabetes, Gestational  Renal/GU negative Renal ROS  negative genitourinary   Musculoskeletal   Abdominal   Peds  Hematology negative hematology ROS (+)   Anesthesia Other Findings   Reproductive/Obstetrics (+) Pregnancy                             Anesthesia Physical Anesthesia Plan  ASA: II  Anesthesia Plan: Epidural   Post-op Pain Management:    Induction:   Airway Management Planned:   Additional Equipment:   Intra-op Plan:   Post-operative Plan:   Informed Consent: I have reviewed the patients History and Physical, chart, labs and discussed the procedure including the risks, benefits and alternatives for the proposed anesthesia with the patient or authorized representative who has indicated his/her understanding and acceptance.     Plan Discussed with:   Anesthesia Plan Comments:         Anesthesia Quick Evaluation

## 2015-04-09 NOTE — Progress Notes (Signed)
S: s/p stadol (+) ctx  O: pitocin  VE 3/60/-2 asynclitic LOA AROM clear fluid IUPC/ISE  Tracing: baseline 150 Ctx q 2-73mins   IMP: latent phase Class A1 GDM P) right exaggerated sims cont pitocin analgesic/epidural prn

## 2015-04-10 ENCOUNTER — Encounter (HOSPITAL_COMMUNITY): Payer: Self-pay

## 2015-04-10 DIAGNOSIS — O24410 Gestational diabetes mellitus in pregnancy, diet controlled: Secondary | ICD-10-CM | POA: Diagnosis present

## 2015-04-10 LAB — GLUCOSE, CAPILLARY
GLUCOSE-CAPILLARY: 89 mg/dL (ref 65–99)
Glucose-Capillary: 59 mg/dL — ABNORMAL LOW (ref 65–99)
Glucose-Capillary: 87 mg/dL (ref 65–99)

## 2015-04-10 MED ORDER — ONDANSETRON HCL 4 MG PO TABS: 4.0000 mg | ORAL_TABLET | ORAL | Status: DC | PRN

## 2015-04-10 MED ORDER — ACETAMINOPHEN 325 MG PO TABS
650.0000 mg | ORAL_TABLET | ORAL | Status: DC | PRN
  Administered 2015-04-10: 650 mg via ORAL
  Filled 2015-04-10: qty 2

## 2015-04-10 MED ORDER — OXYCODONE-ACETAMINOPHEN 5-325 MG PO TABS
2.0000 | ORAL_TABLET | ORAL | Status: DC | PRN
  Filled 2015-04-10: qty 2

## 2015-04-10 MED ORDER — ZOLPIDEM TARTRATE 5 MG PO TABS: 5.0000 mg | ORAL_TABLET | Freq: Every evening | ORAL | Status: DC | PRN

## 2015-04-10 MED ORDER — WITCH HAZEL-GLYCERIN EX PADS: 1.0000 "application " | MEDICATED_PAD | CUTANEOUS | Status: DC | PRN

## 2015-04-10 MED ORDER — SIMETHICONE 80 MG PO CHEW: 80.0000 mg | CHEWABLE_TABLET | ORAL | Status: DC | PRN

## 2015-04-10 MED ORDER — SENNOSIDES-DOCUSATE SODIUM 8.6-50 MG PO TABS
2.0000 | ORAL_TABLET | ORAL | Status: DC
  Administered 2015-04-11: 2 via ORAL
  Filled 2015-04-10 (×2): qty 2

## 2015-04-10 MED ORDER — ONDANSETRON HCL 4 MG/2ML IJ SOLN: 4.0000 mg | INTRAMUSCULAR | Status: DC | PRN

## 2015-04-10 MED ORDER — SODIUM BICARBONATE 8.4 % IV SOLN
INTRAVENOUS | Status: DC | PRN
  Administered 2015-04-10 (×2): 5 mL via EPIDURAL

## 2015-04-10 MED ORDER — OXYCODONE-ACETAMINOPHEN 5-325 MG PO TABS
1.0000 | ORAL_TABLET | ORAL | Status: DC | PRN
  Administered 2015-04-10 – 2015-04-11 (×3): 1 via ORAL
  Filled 2015-04-10 (×2): qty 1

## 2015-04-10 MED ORDER — DIBUCAINE 1 % RE OINT: 1.0000 "application " | TOPICAL_OINTMENT | RECTAL | Status: DC | PRN

## 2015-04-10 MED ORDER — LANOLIN HYDROUS EX OINT: TOPICAL_OINTMENT | CUTANEOUS | Status: DC | PRN

## 2015-04-10 MED ORDER — PRENATAL MULTIVITAMIN CH
1.0000 | ORAL_TABLET | Freq: Every day | ORAL | Status: DC
  Administered 2015-04-10: 1 via ORAL
  Filled 2015-04-10 (×2): qty 1

## 2015-04-10 MED ORDER — BENZOCAINE-MENTHOL 20-0.5 % EX AERO
1.0000 "application " | INHALATION_SPRAY | CUTANEOUS | Status: DC | PRN
  Administered 2015-04-12: 1 via TOPICAL
  Filled 2015-04-10: qty 56

## 2015-04-10 MED ORDER — DIPHENHYDRAMINE HCL 25 MG PO CAPS: 25.0000 mg | ORAL_CAPSULE | Freq: Four times a day (QID) | ORAL | Status: DC | PRN

## 2015-04-10 MED ORDER — FERROUS SULFATE 325 (65 FE) MG PO TABS: 325.0000 mg | ORAL_TABLET | Freq: Two times a day (BID) | ORAL | Status: DC

## 2015-04-10 NOTE — Anesthesia Postprocedure Evaluation (Signed)
  Anesthesia Post-op Note  Patient: Cassandra Mann  Procedure(s) Performed: * No procedures listed *  Patient Location: PACU and Mother/Baby  Anesthesia Type:Epidural  Level of Consciousness: awake, alert  and oriented  Airway and Oxygen Therapy: Patient Spontanous Breathing  Post-op Pain: none  Post-op Assessment: Post-op Vital signs reviewed and Patient's Cardiovascular Status Stable              Post-op Vital Signs: Reviewed and stable  Last Vitals:  Filed Vitals:   04/10/15 1100  BP: 113/60  Pulse: 86  Temp: 36.7 C  Resp: 16    Complications: No apparent anesthesia complications

## 2015-04-10 NOTE — Progress Notes (Signed)
S: sleeping Epidural replaced  O: VE 8cm/0 station  Tracing: baseline 140 (+) early decels Ctx q 2 mins ( 180-200 MVU)  IMP: active phase Class A1 GDM Term P) cont present mgmt.

## 2015-04-10 NOTE — Progress Notes (Signed)
Delivery of live, viable female by Dr. Cherly Hensenousins, APGAR 8,9

## 2015-04-10 NOTE — Progress Notes (Signed)
S; better with epidural  O: pitocin VE 5/100/-2  per RN Tracing: baseline 125 (+) accel Ctx q 2-23mins   BS 59  IMP: Active phase Class A1 GDM Term P) cont present mgmt

## 2015-04-10 NOTE — Lactation Note (Signed)
This note was copied from the chart of Cassandra Richardean Salelexis Zender. Lactation Consultation Note  Patient Name: Cassandra Richardean Salelexis Janes [id number]'s Date: 04/10/2015 Reason for consult: Initial assessment  Visited with Mom, baby 9213 hrs old.  Mom has had difficulty latching baby onto breast, and baby being sleepy.  Unwrapped baby and assisted with latching.  Baby has a tight mouth, and tends to "bite" with his gums.  Talked about suck training and massage of jaw to help him relax.  Tried several times to help him latch deeply onto areola, but he would purse his mouth and slip onto nipple.  Manual breast expression results in a good flow of colostrum.  Initiated a 20 mm and then 24 mm nipple shield to assist with a wider, deeper latch onto areola.  Worked with Mom on positioning and latch.  Occasional swallowing heard, colostrum in the shield noted.  Encouraged skin to skin, and feeding often on cue. Brochure left in room.  Informed Mom of IP and OP lactation services available to her.  Follow up in am, and call prn for assistance. Consult Status Consult Status: Follow-up Date: 04/11/15 Follow-up type: In-patient    Cassandra Mann, Cassandra Mann 04/10/2015, 9:12 PM

## 2015-04-10 NOTE — Progress Notes (Signed)
Patient ID: Cassandra Mann, female   DOB: 02/06/1981, 34 y.o.   MRN: [id number] INTERVAL NOTE:  S:   Sitting in bed, BFing, min cramping, (+) voids, moderate bleed, denies HA/NV/dizziness  O:   VSS, AAO x 3, NAD  2 FB below U  Scant lochia  A / P:   PPD #0  Stable post partum  Routine PP orders  Kenard GowerAWSON, Jamen Loiseau, M, MSN, CNM 04/10/2015, 2:21 PM

## 2015-04-10 NOTE — Progress Notes (Signed)
Provider notified of CBG reading; ok to give pt juice and will recheck in 1 hr. Pt is asymptomatic.

## 2015-04-11 ENCOUNTER — Encounter (HOSPITAL_COMMUNITY): Payer: Self-pay

## 2015-04-11 LAB — CBC
HEMATOCRIT: 30.4 % — AB (ref 36.0–46.0)
Hemoglobin: 10.4 g/dL — ABNORMAL LOW (ref 12.0–15.0)
MCH: 29.9 pg (ref 26.0–34.0)
MCHC: 34.2 g/dL (ref 30.0–36.0)
MCV: 87.4 fL (ref 78.0–100.0)
PLATELETS: 238 10*3/uL (ref 150–400)
RBC: 3.48 MIL/uL — AB (ref 3.87–5.11)
RDW: 14.6 % (ref 11.5–15.5)
WBC: 16.5 10*3/uL — AB (ref 4.0–10.5)

## 2015-04-11 LAB — CULTURE, OB URINE: Special Requests: NORMAL

## 2015-04-11 MED ORDER — TETANUS-DIPHTH-ACELL PERTUSSIS 5-2.5-18.5 LF-MCG/0.5 IM SUSP
0.5000 mL | INTRAMUSCULAR | Status: AC | PRN
  Administered 2015-04-12: 0.5 mL via INTRAMUSCULAR

## 2015-04-11 MED ORDER — OXYCODONE-ACETAMINOPHEN 5-325 MG PO TABS
2.0000 | ORAL_TABLET | ORAL | Status: DC | PRN
Start: 2015-04-11 — End: 2015-04-12
  Administered 2015-04-11 – 2015-04-12 (×5): 2 via ORAL
  Filled 2015-04-11 (×5): qty 2

## 2015-04-11 MED ORDER — HYDROCODONE-ACETAMINOPHEN 7.5-325 MG PO TABS: 2.0000 | ORAL_TABLET | Freq: Four times a day (QID) | ORAL | Status: DC | PRN

## 2015-04-11 MED ORDER — MAGNESIUM OXIDE 400 (241.3 MG) MG PO TABS
400.0000 mg | ORAL_TABLET | Freq: Every day | ORAL | Status: DC
  Administered 2015-04-11 – 2015-04-12 (×2): 400 mg via ORAL
  Filled 2015-04-11 (×2): qty 1

## 2015-04-11 MED ORDER — HYDROCODONE-ACETAMINOPHEN 7.5-325 MG PO TABS: 1.0000 | ORAL_TABLET | Freq: Four times a day (QID) | ORAL | Status: DC | PRN

## 2015-04-11 MED ORDER — POLYSACCHARIDE IRON COMPLEX 150 MG PO CAPS
150.0000 mg | ORAL_CAPSULE | Freq: Every day | ORAL | Status: DC
  Administered 2015-04-12: 150 mg via ORAL
  Filled 2015-04-11: qty 1

## 2015-04-11 NOTE — Progress Notes (Signed)
PPD 1 SVD  S:  Reports feeling lots of cramping and pain (allergy to NSAIDS in past - no Motrin)             Tolerating po/ No nausea or vomiting             Bleeding is moderate             Pain minimally controlled withnarcotic analgesics including percocet - taking 2 as needed             Up ad lib / ambulatory / voiding QS  Newborn breast feeding  / Circumcision planned today O:               VS: BP 100/54 mmHg  Pulse 52  Temp(Src) 98 F (36.7 C) (Oral)  Resp 17  Ht 5\' 5"  (1.651 m)  Wt 100.154 kg (220 lb 12.8 oz)  BMI 36.74 kg/m2  SpO2 100%  LMP 07/06/2014  Breastfeeding? Unknown   LABS:              Recent Labs  04/09/15 0115 04/11/15 0604  WBC 8.7 16.5*  HGB 12.3 10.4*  PLT 288 238               Blood type: --/--/O POS (06/26 0115)  Rubella: Immune (11/18 0000)                      Physical Exam:             Alert and oriented X3  Lungs: Clear and unlabored  Heart: regular rate and rhythm / no mumurs  Abdomen: soft, non-tender, non-distended              Fundus: firm, non-tender, Ueven  Perineum: mild edema  Lochia: light-moderate  Extremities: 2+ pedal edema, no calf pain or tenderness    A: PPD # 1 SVD - 1st degree             Mild ABL anemia - stable             Uncontrolled pain / unable to take NSAIDS             GDMa1 - delivered with hx non-compliance             Dependent edema  Doing well - stable status  P: Routine post partum orders  Take medication scheduled - keep bladder empty (Q2hr while awake) - hydrate well with water              DC Percocet - ad Rx for Vicodin (7.5mg ) dose              Newborn circ today              Anticipate DC tomorrow              2hr GTT 6-12 weeks PP  Marlinda MikeBAILEY, Shatiqua Heroux CNM, MSN, FACNM 04/11/2015, 8:13 AM

## 2015-04-11 NOTE — Lactation Note (Addendum)
This note was copied from the chart of Cassandra Richardean Salelexis Vanderveer. Lactation Consultation Note  Patient Name: Cassandra Mann ZOXWR'UToday's Date: 04/11/2015 Reason for consult: Follow-up assessment   Follow-up consult at 39 hours with only a 1% weight loss at ~15 hrs when weight was done.  Mom is a P1.  Infant was circumcised this morning but has been breastfeeding well today. Infant has breastfed x8 (10-40 min) + attempts x5 (0 min); voids-3 in 24 hours/ 4 life; stools-4 in 24 hours and life. Mom independently demonstrated hand expression with colostrum easily flowing. Mom independently latched to left side football hold with sandwiching of breast without nipple shield.  [facility] to help attain better depth. Mom's milk is coming-in as mom was dripping with milk and swallows frequently heard with rhythmical sucking.  LS-9 (LC assisted with depth). Mom has DEBP at home for use after discharge.  Encouraged to call for assistance as needed.      Maternal Data    Feeding Feeding Type: Breast Fed Length of feed: 15 min  LATCH Score/Interventions Latch: Grasps breast easily, tongue down, lips flanged, rhythmical sucking. Intervention(s): Adjust position;Assist with latch  Audible Swallowing: Spontaneous and intermittent  Type of Nipple: Everted at rest and after stimulation  Comfort (Breast/Nipple): Soft / non-tender     Hold (Positioning): Assistance needed to correctly position infant at breast and maintain latch. (minimal assistance with depth) Intervention(s): Support Pillows  LATCH Score: 9  Lactation Tools Discussed/Used WIC Program: No   Consult Status Consult Status: Follow-up Date: 04/12/15 Follow-up type: In-patient    Lendon KaVann, Nazaria Riesen Walker 04/11/2015, 11:47 PM

## 2015-04-11 NOTE — Progress Notes (Signed)
Pt states that she does not like taking Norco that it makes her feel "wierd". Called CNM and pt changed back to percocet.

## 2015-04-12 ENCOUNTER — Inpatient Hospital Stay (HOSPITAL_COMMUNITY): Admission: AD | Admit: 2015-04-12 | Payer: 59 | Source: Ambulatory Visit | Admitting: Obstetrics and Gynecology

## 2015-04-12 ENCOUNTER — Ambulatory Visit: Payer: Self-pay

## 2015-04-12 MED ORDER — OXYCODONE-ACETAMINOPHEN 5-325 MG PO TABS: 1.0000 | ORAL_TABLET | ORAL | Status: DC | PRN

## 2015-04-12 NOTE — Lactation Note (Signed)
This note was copied from the chart of Cassandra Mann. Lactation Consultation Note  Mom is not using NS to attach baby.  She left prior to lactation assessment.  Patient Name: Cassandra Mann Today's Date: 04/12/2015     Maternal Data    Feeding Length of feed: 5 min  LATCH Score/Interventions Latch: Grasps breast easily, tongue down, lips flanged, rhythmical sucking.  Audible Swallowing: A few with stimulation  Type of Nipple: Flat  Comfort (Breast/Nipple): Filling, red/small blisters or bruises, mild/mod discomfort  Problem noted: Mild/Moderate discomfort  Hold (Positioning): No assistance needed to correctly position infant at breast.  LATCH Score: 7  Lactation Tools Discussed/Used     Consult Status      Soyla DryerJoseph, Macgregor Aeschliman 04/12/2015, 10:38 AM

## 2015-04-12 NOTE — Discharge Summary (Signed)
Obstetric Discharge Summary Reason for Admission: induction of labor and  Class A1GDM, 39 weeks Prenatal Procedures: A1GDM Intrapartum Procedures: spontaneous vaginal delivery and Pitocin, AROM, epidural Postpartum Procedures: none Complications-Operative and Postpartum: 1st degree perineal laceration and ABL anemia HEMOGLOBIN  Date Value Ref Range Status  04/11/2015 10.4* 12.0 - 15.0 g/dL Final   HCT  Date Value Ref Range Status  04/11/2015 30.4* 36.0 - 46.0 % Final    Physical Exam:  General: alert and cooperative Lochia: appropriate Uterine Fundus: firm Incision: healing well, no significant drainage, no dehiscence, no significant erythema DVT Evaluation: No evidence of DVT seen on physical exam. Negative Homan's sign. No cords or calf tenderness. No significant calf/ankle edema.  Discharge Diagnoses: Term Pregnancy-delivered and ABL anemia, Class A1 GDM  Discharge Information: Date: 04/12/2015 Activity: pelvic rest Diet: routine Medications: PNV and Percocet Condition: stable Instructions: refer to practice specific booklet Discharge to: home   Newborn Data: Live born female on 04[birth year]10/15 Birth Weight: 6 lb 13 oz (3090 g) APGAR: 8, 9 Baby circ  Home with mother.  BHAMBRI, MELANIE, N 04/12/2015, 10:00 AM

## 2015-04-12 NOTE — Progress Notes (Addendum)
PPD #2- SVD  Subjective:   Reports feeling well, ready for discharge Tolerating po/ No nausea or vomiting Bleeding is light Pain controlled with Percocet Up ad lib / ambulatory / voiding without problems Newborn: breastfeeding  / Circumcision: done   Objective:   VS: VS:  Filed Vitals:   04/10/15 1805 04/11/15 0704 04/11/15 1744 04/12/15 0616  BP: 94/50 100/54 112/54 104/58  Pulse: 75 52 68 84  Temp: 98.4 F (36.9 C) 98 F (36.7 C) 97.5 F (36.4 C) 98.3 F (36.8 C)  TempSrc: Axillary Oral Oral Oral  Resp: 18 17 18 17   Height:      Weight:      SpO2:  100%  99%    LABS:  Recent Labs  04/11/15 0604  WBC 16.5*  HGB 10.4*  PLT 238   Blood type: --/--/O POS (06/26 0115) Rubella: Immune (11/18 0000)                I&O: Intake/Output      06/28 0701 - 06/29 0700 06/29 0701 - 06/30 0700   Urine (mL/kg/hr)     Blood     Total Output       Net              Physical Exam: Alert and oriented X3 Abdomen: soft, non-tender, non-distended  Fundus: firm, non-tender, U-3 Perineum: Well approximated, no significant erythema, edema, or drainage; healing well. Lochia: small Extremities: trace BLE edema, no calf pain or tenderness    Assessment: PPD #2  G3P1021/ S/P:spontaneous vaginal, 1st degree laceration ABL anemia Doing well - stable for discharge home   Plan: Discharge home Tdap prior to discharge RX's:  Percocet 5/325 1 to 2 po Q 4 hrs prn pain #30 Refill x 0 Follow-up for routine pp visit in 6wks at [facility] Follow-up for DS at [facility] 6-12 weeks [facility] booklet given    Donette LarryBHAMBRI, Rosland Riding, N MSN, CNM 04/12/2015, 9:59 AM

## 2015-08-29 ENCOUNTER — Other Ambulatory Visit: Payer: Self-pay | Admitting: Obstetrics and Gynecology

## 2015-08-29 ENCOUNTER — Encounter (HOSPITAL_COMMUNITY)
Admission: RE | Admit: 2015-08-29 | Discharge: 2015-08-29 | Disposition: A | Discharge status: Home / Self Care | Payer: 59 | Source: Ambulatory Visit | Attending: Obstetrics and Gynecology | Admitting: Obstetrics and Gynecology

## 2015-08-29 ENCOUNTER — Encounter (HOSPITAL_COMMUNITY): Payer: Self-pay

## 2015-08-29 DIAGNOSIS — R102 Pelvic and perineal pain: Secondary | ICD-10-CM | POA: Diagnosis not present

## 2015-08-29 DIAGNOSIS — N803 Endometriosis of pelvic peritoneum: Secondary | ICD-10-CM | POA: Diagnosis not present

## 2015-08-29 DIAGNOSIS — N920 Excessive and frequent menstruation with regular cycle: Secondary | ICD-10-CM | POA: Diagnosis not present

## 2015-08-29 LAB — CBC
HCT: 39.8 % (ref 36.0–46.0)
Hemoglobin: 13.7 g/dL (ref 12.0–15.0)
MCH: 29 pg (ref 26.0–34.0)
MCHC: 34.4 g/dL (ref 30.0–36.0)
MCV: 84.3 fL (ref 78.0–100.0)
PLATELETS: 311 10*3/uL (ref 150–400)
RBC: 4.72 MIL/uL (ref 3.87–5.11)
RDW: 14.4 % (ref 11.5–15.5)
WBC: 5.9 10*3/uL (ref 4.0–10.5)

## 2015-08-29 NOTE — Patient Instructions (Signed)
Your procedure is scheduled on:  August 31, 2015    [facility] at:  6:00 am   Pick up the phone at the desk and dial [phone].  Call this number if you have problems the morning of surgery: [phone].  Remember: Do NOT eat food: after midnight on Wednesday  Do NOT drink clear liquids after:  Midnight on Wednesday  Take these medicines the morning of surgery with a SIP OF WATER:  None   Do NOT wear jewelry (body piercing), metal hair clips/bobby pins, make-up, or nail polish. Do NOT wear lotions, powders, or perfumes.  You may wear deoderant. Do NOT shave for 48 hours prior to surgery. Do NOT bring valuables to the hospital. Contacts, dentures, or bridgework may not be worn into surgery. Have a responsible adult drive you home and stay with you for 24 hours after your procedure

## 2015-08-31 ENCOUNTER — Encounter (HOSPITAL_COMMUNITY)
Admission: RE | Disposition: A | Discharge status: Home / Self Care | Payer: Self-pay | Source: Ambulatory Visit | Attending: Obstetrics and Gynecology

## 2015-08-31 ENCOUNTER — Encounter (HOSPITAL_COMMUNITY): Payer: Self-pay | Admitting: Anesthesiology

## 2015-08-31 ENCOUNTER — Ambulatory Visit (HOSPITAL_COMMUNITY): Payer: 59 | Admitting: Anesthesiology

## 2015-08-31 ENCOUNTER — Ambulatory Visit (HOSPITAL_COMMUNITY)
Admission: RE | Admit: 2015-08-31 | Discharge: 2015-08-31 | Disposition: A | Discharge status: Home / Self Care | Payer: 59 | Source: Ambulatory Visit | Attending: Obstetrics and Gynecology | Admitting: Obstetrics and Gynecology

## 2015-08-31 DIAGNOSIS — N920 Excessive and frequent menstruation with regular cycle: Secondary | ICD-10-CM | POA: Insufficient documentation

## 2015-08-31 DIAGNOSIS — N803 Endometriosis of pelvic peritoneum, unspecified: Secondary | ICD-10-CM

## 2015-08-31 DIAGNOSIS — R102 Pelvic and perineal pain: Secondary | ICD-10-CM | POA: Insufficient documentation

## 2015-08-31 HISTORY — PX: LAPAROSCOPY: SHX197

## 2015-08-31 HISTORY — PX: DILATATION & CURRETTAGE/HYSTEROSCOPY WITH RESECTOCOPE: SHX5572

## 2015-08-31 LAB — PREGNANCY, URINE: Preg Test, Ur: NEGATIVE

## 2015-08-31 SURGERY — LAPAROSCOPY, DIAGNOSTIC
Anesthesia: General | Site: Vagina

## 2015-08-31 MED ORDER — LIDOCAINE HCL (CARDIAC) 20 MG/ML IV SOLN
INTRAVENOUS | Status: DC | PRN
  Administered 2015-08-31: 70 mg via INTRAVENOUS
  Administered 2015-08-31: 30 mg via INTRAVENOUS

## 2015-08-31 MED ORDER — MIDAZOLAM HCL 2 MG/2ML IJ SOLN
INTRAMUSCULAR | Status: AC
  Filled 2015-08-31: qty 2

## 2015-08-31 MED ORDER — ROPIVACAINE HCL 5 MG/ML IJ SOLN
INTRAMUSCULAR | Status: AC
  Filled 2015-08-31: qty 60

## 2015-08-31 MED ORDER — BUPIVACAINE HCL (PF) 0.25 % IJ SOLN
INTRAMUSCULAR | Status: AC
  Filled 2015-08-31: qty 30

## 2015-08-31 MED ORDER — MEPERIDINE HCL 25 MG/ML IJ SOLN
6.2500 mg | INTRAMUSCULAR | Status: DC | PRN
  Administered 2015-08-31: 12.5 mg via INTRAVENOUS

## 2015-08-31 MED ORDER — LACTATED RINGERS IV SOLN: INTRAVENOUS | Status: DC

## 2015-08-31 MED ORDER — LACTATED RINGERS IR SOLN
Status: DC | PRN
  Administered 2015-08-31: 3000 mL

## 2015-08-31 MED ORDER — MIDAZOLAM HCL 2 MG/2ML IJ SOLN
INTRAMUSCULAR | Status: DC | PRN
  Administered 2015-08-31: 2 mg via INTRAVENOUS

## 2015-08-31 MED ORDER — ONDANSETRON HCL 4 MG/2ML IJ SOLN
INTRAMUSCULAR | Status: DC | PRN
  Administered 2015-08-31: 4 mg via INTRAVENOUS

## 2015-08-31 MED ORDER — BUPIVACAINE HCL (PF) 0.25 % IJ SOLN
INTRAMUSCULAR | Status: DC | PRN
  Administered 2015-08-31: 6 mL

## 2015-08-31 MED ORDER — GLYCOPYRROLATE 0.2 MG/ML IJ SOLN
INTRAMUSCULAR | Status: AC
  Filled 2015-08-31: qty 3

## 2015-08-31 MED ORDER — PROMETHAZINE HCL 25 MG/ML IJ SOLN: 6.2500 mg | INTRAMUSCULAR | Status: DC | PRN

## 2015-08-31 MED ORDER — DEXAMETHASONE SODIUM PHOSPHATE 10 MG/ML IJ SOLN
INTRAMUSCULAR | Status: DC | PRN
Start: 2015-08-31 — End: 2015-08-31
  Administered 2015-08-31: 4 mg via INTRAVENOUS

## 2015-08-31 MED ORDER — SCOPOLAMINE 1 MG/3DAYS TD PT72
1.0000 | MEDICATED_PATCH | Freq: Once | TRANSDERMAL | Status: DC
  Administered 2015-08-31: 1.5 mg via TRANSDERMAL

## 2015-08-31 MED ORDER — HYDROMORPHONE HCL 2 MG PO TABS
ORAL_TABLET | ORAL | Status: AC
  Filled 2015-08-31: qty 1

## 2015-08-31 MED ORDER — SODIUM CHLORIDE 0.9 % IJ SOLN
INTRAMUSCULAR | Status: AC
  Filled 2015-08-31: qty 10

## 2015-08-31 MED ORDER — SCOPOLAMINE 1 MG/3DAYS TD PT72
MEDICATED_PATCH | TRANSDERMAL | Status: AC
  Filled 2015-08-31: qty 1

## 2015-08-31 MED ORDER — SODIUM CHLORIDE 0.9 % IJ SOLN
INTRAMUSCULAR | Status: AC
  Filled 2015-08-31: qty 50

## 2015-08-31 MED ORDER — LIDOCAINE HCL (CARDIAC) 20 MG/ML IV SOLN
INTRAVENOUS | Status: AC
  Filled 2015-08-31: qty 5

## 2015-08-31 MED ORDER — FENTANYL CITRATE (PF) 100 MCG/2ML IJ SOLN
INTRAMUSCULAR | Status: DC | PRN
  Administered 2015-08-31 (×7): 50 ug via INTRAVENOUS

## 2015-08-31 MED ORDER — HYDROMORPHONE HCL 2 MG PO TABS
4.0000 mg | ORAL_TABLET | Freq: Once | ORAL | Status: AC
  Administered 2015-08-31: 4 mg via ORAL

## 2015-08-31 MED ORDER — NEOSTIGMINE METHYLSULFATE 10 MG/10ML IV SOLN
INTRAVENOUS | Status: DC | PRN
  Administered 2015-08-31: 4 mg via INTRAVENOUS

## 2015-08-31 MED ORDER — GLYCINE 1.5 % IR SOLN
Status: DC | PRN
  Administered 2015-08-31: 3000 mL

## 2015-08-31 MED ORDER — FENTANYL CITRATE (PF) 100 MCG/2ML IJ SOLN
INTRAMUSCULAR | Status: AC
  Filled 2015-08-31: qty 2

## 2015-08-31 MED ORDER — SODIUM CHLORIDE 0.9 % IV SOLN
INTRAVENOUS | Status: DC | PRN
  Administered 2015-08-31: 120 mL

## 2015-08-31 MED ORDER — FENTANYL CITRATE (PF) 250 MCG/5ML IJ SOLN
INTRAMUSCULAR | Status: AC
  Filled 2015-08-31: qty 5

## 2015-08-31 MED ORDER — ACETAMINOPHEN 10 MG/ML IV SOLN
1000.0000 mg | Freq: Once | INTRAVENOUS | Status: AC
  Administered 2015-08-31: 1000 mg via INTRAVENOUS
  Filled 2015-08-31: qty 100

## 2015-08-31 MED ORDER — NEOSTIGMINE METHYLSULFATE 10 MG/10ML IV SOLN
INTRAVENOUS | Status: AC
  Filled 2015-08-31: qty 1

## 2015-08-31 MED ORDER — ROCURONIUM BROMIDE 100 MG/10ML IV SOLN
INTRAVENOUS | Status: DC | PRN
  Administered 2015-08-31: 60 mg via INTRAVENOUS
  Administered 2015-08-31: 20 mg via INTRAVENOUS

## 2015-08-31 MED ORDER — KETOROLAC TROMETHAMINE 30 MG/ML IJ SOLN
INTRAMUSCULAR | Status: AC
  Filled 2015-08-31: qty 1

## 2015-08-31 MED ORDER — PROPOFOL 10 MG/ML IV BOLUS
INTRAVENOUS | Status: AC
  Filled 2015-08-31: qty 20

## 2015-08-31 MED ORDER — HYDROMORPHONE HCL 4 MG PO TABS: 4.0000 mg | ORAL_TABLET | Freq: Four times a day (QID) | ORAL | Status: DC | PRN

## 2015-08-31 MED ORDER — PROPOFOL 10 MG/ML IV BOLUS
INTRAVENOUS | Status: DC | PRN
  Administered 2015-08-31: 180 mg via INTRAVENOUS

## 2015-08-31 MED ORDER — HYDROMORPHONE HCL 1 MG/ML IJ SOLN
INTRAMUSCULAR | Status: AC
  Filled 2015-08-31: qty 1

## 2015-08-31 MED ORDER — GLYCOPYRROLATE 0.2 MG/ML IJ SOLN
INTRAMUSCULAR | Status: DC | PRN
  Administered 2015-08-31: 0.6 mg via INTRAVENOUS

## 2015-08-31 MED ORDER — LACTATED RINGERS IV SOLN
INTRAVENOUS | Status: DC
  Administered 2015-08-31 (×3): via INTRAVENOUS

## 2015-08-31 MED ORDER — HYDROMORPHONE HCL 1 MG/ML IJ SOLN
0.2500 mg | INTRAMUSCULAR | Status: DC | PRN
  Administered 2015-08-31 (×4): 0.5 mg via INTRAVENOUS

## 2015-08-31 MED ORDER — DEXAMETHASONE SODIUM PHOSPHATE 4 MG/ML IJ SOLN
INTRAMUSCULAR | Status: AC
  Filled 2015-08-31: qty 1

## 2015-08-31 MED ORDER — MEPERIDINE HCL 25 MG/ML IJ SOLN
INTRAMUSCULAR | Status: DC
Start: 2015-08-31 — End: 2015-08-31
  Filled 2015-08-31: qty 1

## 2015-08-31 MED ORDER — ROCURONIUM BROMIDE 100 MG/10ML IV SOLN
INTRAVENOUS | Status: AC
  Filled 2015-08-31: qty 1

## 2015-08-31 MED ORDER — ARTIFICIAL TEARS OP OINT
TOPICAL_OINTMENT | OPHTHALMIC | Status: AC
  Filled 2015-08-31: qty 3.5

## 2015-08-31 MED ORDER — HYDROMORPHONE HCL 1 MG/ML IJ SOLN
0.5000 mg | INTRAMUSCULAR | Status: AC | PRN
  Administered 2015-08-31 (×2): 0.5 mg via INTRAVENOUS

## 2015-08-31 SURGICAL SUPPLY — 73 items
APPLICATOR COTTON TIP 6IN STRL (MISCELLANEOUS) ×5 IMPLANT
BARRIER ADHS 3X4 INTERCEED (GAUZE/BANDAGES/DRESSINGS) ×5 IMPLANT
BRR ADH 4X3 ABS CNTRL BYND (GAUZE/BANDAGES/DRESSINGS) ×3
CABLE HIGH FREQUENCY MONO STRZ (ELECTRODE) ×3 IMPLANT
CANISTER SUCT 3000ML (MISCELLANEOUS) ×5 IMPLANT
CATH FOLEY LF 3WAY 5CC16FR (CATHETERS) ×3 IMPLANT
CATH ROBINSON RED A/P 16FR (CATHETERS) ×5 IMPLANT
CHLORAPREP W/TINT 26ML (MISCELLANEOUS) ×5 IMPLANT
CLOTH BEACON ORANGE TIMEOUT ST (SAFETY) ×5 IMPLANT
CONT PATH 16OZ SNAP LID 3702 (MISCELLANEOUS) ×5 IMPLANT
CONTAINER PREFILL 10% NBF 60ML (FORM) ×10 IMPLANT
COVER BACK TABLE 60X90IN (DRAPES) ×10 IMPLANT
COVER TIP SHEARS 8 DVNC (MISCELLANEOUS) ×3 IMPLANT
COVER TIP SHEARS 8MM DA VINCI (MISCELLANEOUS) ×2
DECANTER SPIKE VIAL GLASS SM (MISCELLANEOUS) ×5 IMPLANT
DEFOGGER SCOPE WARMER CLEARIFY (MISCELLANEOUS) ×3 IMPLANT
DRAPE WARM FLUID 44X44 (DRAPE) ×5 IMPLANT
DRSG COVADERM PLUS 2X2 (GAUZE/BANDAGES/DRESSINGS) ×20 IMPLANT
DRSG OPSITE POSTOP 3X4 (GAUZE/BANDAGES/DRESSINGS) ×5 IMPLANT
ELECT LIGASURE LONG (ELECTRODE) IMPLANT
ELECT REM PT RETURN 9FT ADLT (ELECTROSURGICAL) ×5
ELECTRODE REM PT RTRN 9FT ADLT (ELECTROSURGICAL) ×3 IMPLANT
FORCEPS CUTTING 33CM 5MM (CUTTING FORCEPS) IMPLANT
FORCEPS CUTTING 45CM 5MM (CUTTING FORCEPS) IMPLANT
GAUZE VASELINE 3X9 (GAUZE/BANDAGES/DRESSINGS) IMPLANT
GLOVE BIOGEL PI IND STRL 7.0 (GLOVE) ×13 IMPLANT
GLOVE BIOGEL PI INDICATOR 7.0 (GLOVE) ×14
GLOVE ECLIPSE 6.5 STRL STRAW (GLOVE) ×5 IMPLANT
GOWN STRL REUS W/TWL LRG LVL3 (GOWN DISPOSABLE) ×15 IMPLANT
KIT ACCESSORY DA VINCI DISP (KITS) ×2
KIT ACCESSORY DVNC DISP (KITS) ×3 IMPLANT
LEGGING LITHOTOMY PAIR STRL (DRAPES) ×5 IMPLANT
LIQUID BAND (GAUZE/BANDAGES/DRESSINGS) ×5 IMPLANT
LOOP ANGLED CUTTING 22FR (CUTTING LOOP) IMPLANT
NEEDLE INSUFFLATION 120MM (ENDOMECHANICALS) ×5 IMPLANT
NEEDLE INSUFFLATION 150MM (ENDOMECHANICALS) ×3 IMPLANT
NS IRRIG 1000ML POUR BTL (IV SOLUTION) ×15 IMPLANT
PACK LAPAROSCOPY BASIN (CUSTOM PROCEDURE TRAY) ×5 IMPLANT
PACK ROBOT WH (CUSTOM PROCEDURE TRAY) ×5 IMPLANT
PACK ROBOTIC GOWN (GOWN DISPOSABLE) ×5 IMPLANT
PACK VAGINAL MINOR WOMEN LF (CUSTOM PROCEDURE TRAY) ×5 IMPLANT
PAD OB MATERNITY 4.3X12.25 (PERSONAL CARE ITEMS) ×5 IMPLANT
PAD POSITIONING PINK XL (MISCELLANEOUS) ×5 IMPLANT
PAD PREP 24X48 CUFFED NSTRL (MISCELLANEOUS) ×10 IMPLANT
SCISSORS LAP 5X35 DISP (ENDOMECHANICALS) IMPLANT
SCISSORS LAP 5X45 EPIX DISP (ENDOMECHANICALS) ×3 IMPLANT
SET CYSTO W/LG BORE CLAMP LF (SET/KITS/TRAYS/PACK) ×3 IMPLANT
SET IRRIG TUBING LAPAROSCOPIC (IRRIGATION / IRRIGATOR) ×5 IMPLANT
SET TRI-LUMEN FLTR TB AIRSEAL (TUBING) ×3 IMPLANT
SOLUTION ELECTROLUBE (MISCELLANEOUS) ×3 IMPLANT
SUT VICRYL 0 UR6 27IN ABS (SUTURE) ×5 IMPLANT
SUT VICRYL 4-0 PS2 18IN ABS (SUTURE) ×10 IMPLANT
SYR 50ML LL SCALE MARK (SYRINGE) ×5 IMPLANT
SYSTEM CONVERTIBLE TROCAR (TROCAR) IMPLANT
TIP UTERINE 5.1X6CM LAV DISP (MISCELLANEOUS) IMPLANT
TIP UTERINE 6.7X10CM GRN DISP (MISCELLANEOUS) IMPLANT
TIP UTERINE 6.7X6CM WHT DISP (MISCELLANEOUS) IMPLANT
TIP UTERINE 6.7X8CM BLUE DISP (MISCELLANEOUS) ×3 IMPLANT
TOWEL OR 17X24 6PK STRL BLUE (TOWEL DISPOSABLE) ×15 IMPLANT
TRAY FOLEY BAG SILVER LF 16FR (SET/KITS/TRAYS/PACK) ×5 IMPLANT
TROCAR BALLN 12MMX100 BLUNT (TROCAR) IMPLANT
TROCAR DISP BLADELESS 8 DVNC (TROCAR) ×3 IMPLANT
TROCAR DISP BLADELESS 8MM (TROCAR) ×2
TROCAR OPTI TIP 12M 100M (ENDOMECHANICALS) ×5 IMPLANT
TROCAR OPTI TIP 5M 100M (ENDOMECHANICALS) ×10 IMPLANT
TROCAR PORT AIRSEAL 5X120 (TROCAR) ×3 IMPLANT
TROCAR XCEL DIL TIP R 11M (ENDOMECHANICALS) ×5 IMPLANT
TROCAR XCEL NON-BLD 5MMX100MML (ENDOMECHANICALS) ×6 IMPLANT
TROCAR Z-THREAD 12X150 (TROCAR) ×5 IMPLANT
TUBING AQUILEX INFLOW (TUBING) ×5 IMPLANT
TUBING AQUILEX OUTFLOW (TUBING) ×5 IMPLANT
WARMER LAPAROSCOPE (MISCELLANEOUS) ×2 IMPLANT
WATER STERILE IRR 1000ML POUR (IV SOLUTION) ×5 IMPLANT

## 2015-08-31 NOTE — Anesthesia Postprocedure Evaluation (Signed)
  Anesthesia Post-op Note  Patient: Kerri PerchesAlexis C Codner  Procedure(s) Performed: Procedure(s): DIAGNOSTIC LAPAROSCOPY ; with fulgeration  of endometriosis  stage 1  peritoneal  biopsy (N/A) DILATATION & CURETTAGE , DIAGNOSTIC HYSTEROSCOPY (N/A)  Patient Location: PACU  Anesthesia Type:General  Level of Consciousness: awake and alert   Airway and Oxygen Therapy: Patient Spontanous Breathing  Post-op Pain: mild  Post-op Assessment: Post-op Vital signs reviewed and Patient's Cardiovascular Status Stable              Post-op Vital Signs: Reviewed  Last Vitals:  Filed Vitals:   08/31/15 1130  BP: 98/61  Pulse: 81  Temp: 36.8 C  Resp: 19    Complications: No apparent anesthesia complications

## 2015-08-31 NOTE — Brief Op Note (Signed)
08/31/2015  9:47 AM  PATIENT:  Cassandra Mann  34 y.o. female  PRE-OPERATIVE DIAGNOSIS:  Pelvic pain, menorrhagia  POST-OPERATIVE DIAGNOSIS:  pelvic pain menorrhagia, stage I pelvic endometriosis  PROCEDURE:  Procedure(s): DIAGNOSTIC LAPAROSCOPY ; with fulgeration  of endometriosis  stage 1  peritoneal  biopsy (N/A) DILATATION & CURETTAGE , DIAGNOSTIC HYSTEROSCOPY (N/A)  SURGEON:  Surgeon(s) and Role:    * Maxie BetterSheronette Aubriel Khanna, MD - Primary  PHYSICIAN ASSISTANT:   ASSISTANTS: Marlinda Mikeanya Bailey CNM ( laparoscopy)   ANESTHESIA:   general Findings: thickened bleeding endometrium, tubal ostia seen no polyp noted.  Nl appendix, liver edge rv uterus, nl tubes and ovaries, left ovary sl adherent to left side wasll, right ant wall adhesions, post and ant cul de sac  Peritoneal blebs, bleeding( pt on cycle) suggestive of endometriosis EBL:  Total I/O In: 2000 [I.V.:2000] Out: 175 [Urine:150; Blood:25]  BLOOD ADMINISTERED:none  DRAINS: none   LOCAL MEDICATIONS USED:  BUPIVICAINE   SPECIMEN:  Source of Specimen:  emc, anterior cul de sac peritoneal biopsy  DISPOSITION OF SPECIMEN:  PATHOLOGY  COUNTS:  YES  TOURNIQUET:  * No tourniquets in log *  DICTATION: .Other Dictation: Dictation Number [id number]  PLAN OF CARE: Discharge to home after PACU  PATIENT DISPOSITION:  PACU - hemodynamically stable.   Delay start of Pharmacological VTE agent (>24hrs) due to surgical blood loss or risk of bleeding: no

## 2015-08-31 NOTE — Anesthesia Procedure Notes (Signed)
Procedure Name: Intubation Date/Time: 08/31/2015 7:41 AM Performed by: Suella GroveMOORE, Sonnie Pawloski C Pre-anesthesia Checklist: Patient identified, Timeout performed, Emergency Drugs available, Suction available and Patient being monitored Patient Re-evaluated:Patient Re-evaluated prior to inductionOxygen Delivery Method: Circle system utilized and Simple face mask Preoxygenation: Pre-oxygenation with 100% oxygen Intubation Type: IV induction and Inhalational induction Ventilation: Mask ventilation without difficulty Laryngoscope Size: Mac and 3 Grade View: Grade III Tube type: Oral Tube size: 7.0 mm Number of attempts: 1 Airway Equipment and Method: Stylet Placement Confirmation: positive ETCO2 and breath sounds checked- equal and bilateral Secured at: 22 (com) cm Tube secured with: Tape Dental Injury: Teeth and Oropharynx as per pre-operative assessment

## 2015-08-31 NOTE — Transfer of Care (Signed)
Immediate Anesthesia Transfer of Care Note  Patient: Cassandra Mann  Procedure(s) Performed: Procedure(s): DIAGNOSTIC LAPAROSCOPY ; with fulgeration  of endometriosis  stage 1  peritoneal  biopsy (N/A) DILATATION & CURETTAGE , DIAGNOSTIC HYSTEROSCOPY (N/A)  Patient Location: PACU  Anesthesia Type:General  Level of Consciousness: awake, alert , oriented and patient cooperative  Airway & Oxygen Therapy: Patient Spontanous Breathing and Patient connected to nasal cannula oxygen  Post-op Assessment: Report given to RN and Post -op Vital signs reviewed and stable  Post vital signs: Reviewed and stable  Last Vitals:  Filed Vitals:   08/31/15 0600  BP: 114/74  Pulse: 74  Temp: 36.3 C    Complications: No apparent anesthesia complications

## 2015-08-31 NOTE — Anesthesia Preprocedure Evaluation (Addendum)
Anesthesia Evaluation  Patient identified by MRN, date of birth, ID band Patient awake    Reviewed: Allergy & Precautions, NPO status , Patient's Chart, lab work & pertinent test results  Airway Mallampati: II  TM Distance: >3 FB Neck ROM: Full    Dental  (+) Teeth Intact   Pulmonary former smoker,    breath sounds clear to auscultation       Cardiovascular negative cardio ROS  + Valvular Problems/Murmurs  Rhythm:Regular Rate:Normal     Neuro/Psych  Headaches, negative psych ROS   GI/Hepatic negative GI ROS, Neg liver ROS,   Endo/Other  diabetes, Gestational  Renal/GU   negative genitourinary   Musculoskeletal negative musculoskeletal ROS (+)   Abdominal   Peds negative pediatric ROS (+)  Hematology negative hematology ROS (+)   Anesthesia Other Findings   Reproductive/Obstetrics negative OB ROS                            Lab Results  Component Value Date   WBC 5.9 08/29/2015   HGB 13.7 08/29/2015   HCT 39.8 08/29/2015   MCV 84.3 08/29/2015   PLT 311 08/29/2015   Lab Results  Component Value Date   CREATININE 0.87 03/03/2014   BUN 10 03/03/2014   NA 137 03/03/2014   K 4.8 03/03/2014   CL 104 03/03/2014   CO2 24 03/03/2014   No results found for: INR, PROTIME   Anesthesia Physical Anesthesia Plan  ASA: II  Anesthesia Plan: General   Post-op Pain Management:    Induction: Intravenous  Airway Management Planned: Oral ETT  Additional Equipment:   Intra-op Plan:   Post-operative Plan: Extubation in OR  Informed Consent: I have reviewed the patients History and Physical, chart, labs and discussed the procedure including the risks, benefits and alternatives for the proposed anesthesia with the patient or authorized representative who has indicated his/her understanding and acceptance.   Dental advisory given  Plan Discussed with: CRNA  Anesthesia Plan Comments:          Anesthesia Quick Evaluation

## 2015-08-31 NOTE — Discharge Instructions (Signed)
CALL  IF TEMP>100.4, NOTHING PER VAGINA X 2 WK, CALL IF SOAKING A MAXI  PAD EVERY HOUR OR MORE FREQUENTLYDISCHARGE INSTRUCTIONS: Laparoscopy  The following instructions have been prepared to help you care for yourself upon your return home today.  Wound care:  Do not get the incision wet for the first 24 hours. The incision should be kept clean and dry.  The Band-Aids or dressings may be removed the day after surgery.  Should the incision become sore, red, and swollen after the first week, check with your doctor.  Personal hygiene:  Shower the day after your procedure.  Activity and limitations:  Do NOT drive or operate any equipment today.  Do NOT lift anything more than 15 pounds for 2-3 weeks after surgery.  Do NOT rest in bed all day.  Walking is encouraged. Walk each day, starting slowly with 5-minute walks 3 or 4 times a day. Slowly increase the length of your walks.  Walk up and down stairs slowly.  Do NOT do strenuous activities, such as golfing, playing tennis, bowling, running, biking, weight lifting, gardening, mowing, or vacuuming for 2-4 weeks. Ask your doctor when it is okay to start.  Diet: Eat a light meal as desired this evening. You may resume your usual diet tomorrow.  Return to work: This is dependent on the type of work you do. For the most part you can return to a desk job within a week of surgery. If you are more active at work, please discuss this with your doctor.  What to expect after your surgery: You may have a slight burning sensation when you urinate on the first day. You may have a very small amount of blood in the urine. Expect to have a small amount of vaginal discharge/light bleeding for 1-2 weeks. It is not unusual to have abdominal soreness and bruising for up to 2 weeks. You may be tired and need more rest for about 1 week. You may experience shoulder pain for 24-72 hours. Lying flat in bed may relieve it.  Call your doctor for any of the  following:  Develop a fever of 100.4 or greater  Inability to urinate 6 hours after discharge from hospital  Severe pain not relieved by pain medications  Persistent of heavy bleeding at incision site  Redness or swelling around incision site after a week  Increasing nausea or vomiting  Patient Signature________________________________________ Nurse Signature_________________________________________DISCHARGE INSTRUCTIONS: HYSTEROSCOPY / ENDOMETRIAL ABLATION The following instructions have been prepared to help you care for yourself upon your return home.  May Remove Scop patch on or before  May take Ibuprofen after  May take stool softner while taking narcotic pain medication to prevent constipation.  Drink plenty of water.  Personal hygiene:  Use sanitary pads for vaginal drainage, not tampons.  Shower the day after your procedure.  NO tub baths, pools or Jacuzzis for 2-3 weeks.  Wipe front to back after using the bathroom.  Activity and limitations:  Do NOT drive or operate any equipment for 24 hours. The effects of anesthesia are still present and drowsiness may result.  Do NOT rest in bed all day.  Walking is encouraged.  Walk up and down stairs slowly.  You may resume your normal activity in one to two days or as indicated by your physician. Sexual activity: NO intercourse for at least 2 weeks after the procedure, or as indicated by your Doctor.  Diet: Eat a light meal as desired this evening. You may resume your  usual diet tomorrow.  Return to Work: You may resume your work activities in one to two days or as indicated by Therapist, sports.  What to expect after your surgery: Expect to have vaginal bleeding/discharge for 2-3 days and spotting for up to 10 days. It is not unusual to have soreness for up to 1-2 weeks. You may have a slight burning sensation when you urinate for the first day. Mild cramps may continue for a couple of days. You may have a regular  period in 2-6 weeks.  Call your doctor for any of the following:  Excessive vaginal bleeding or clotting, saturating and changing one pad every hour.  Inability to urinate 6 hours after discharge from hospital.  Pain not relieved by pain medication.  Fever of 100.4 F or greater.  Unusual vaginal discharge or odor.  Return to office _________________Call for an appointment ___________________ Patients signature: ______________________ Nurses signature ________________________  Post Anesthesia Care Unit [phone] Post Anesthesia Home Care Instructions  Activity: Get plenty of rest for the remainder of the day. A responsible adult should stay with you for 24 hours following the procedure.  For the next 24 hours, DO NOT: -Drive a car -Advertising copywriter -Drink alcoholic beverages -Take any medication unless instructed by your physician -Make any legal decisions or sign important papers.  Meals: Start with liquid foods such as gelatin or soup. Progress to regular foods as tolerated. Avoid greasy, spicy, heavy foods. If nausea and/or vomiting occur, drink only clear liquids until the nausea and/or vomiting subsides. Call your physician if vomiting continues.  Special Instructions/Symptoms: Your throat may feel dry or sore from the anesthesia or the breathing tube placed in your throat during surgery. If this causes discomfort, gargle with warm salt water. The discomfort should disappear within 24 hours.  If you had a scopolamine patch placed behind your ear for the management of post- operative nausea and/or vomiting:  1. The medication in the patch is effective for 72 hours, after which it should be removed.  Wrap patch in a tissue and discard in the trash. Wash hands thoroughly with soap and water. 2. You may remove the patch earlier than 72 hours if you experience unpleasant side effects which may include dry mouth, dizziness or visual disturbances. 3. Avoid touching the  patch. Wash your hands with soap and water after contact with the patch.

## 2015-09-01 ENCOUNTER — Encounter (HOSPITAL_COMMUNITY): Payer: Self-pay | Admitting: Obstetrics and Gynecology

## 2015-09-01 NOTE — Op Note (Signed)
NAMRichardean Sale:  Vallin, Annette                 ACCOUNT NO.:  [id number]  MEDICAL RECORD NO.:  [id number]  LOCATION:  [facility]                          FACILITY:  [facility]  PHYSICIAN:  Maxie BetterSheronette Vassie Kugel, M.D.DATE OF BIRTH:  November 24, 1980  DATE OF PROCEDURE:  08/31/2015 DATE OF DISCHARGE:  08/31/2015                              OPERATIVE REPORT   PREOPERATIVE DIAGNOSES:  Pelvic pain, menorrhagia.  PROCEDURE:  Diagnostic hysteroscopy, dilation and curettage.  Diagnostic laparoscopy, fulguration of stage I pelvic endometriosis, peritoneal biopsy.  POSTOPERATIVE DIAGNOSES:  Stage I pelvic endometriosis, menorrhagia, pelvic pain.  ANESTHESIA:  General.  SURGEON:  Maxie BetterSheronette Leopoldo Mazzie, MD  FIRST ASSISTANT:  Marlinda Mikeanya Bailey, CNM  DESCRIPTION OF PROCEDURE:  Under adequate general anesthesia, the patient was placed in the dorsal lithotomy position.  She was sterilely prepped and draped in usual fashion and was positioned for possible robotic surgery.  Examination under anesthesia revealed a retroverted uterus.  No adnexal masses could be appreciated.  The patient was bleeding from the vagina.  An indwelling Foley catheter was sterilely placed.  Bivalve speculum was placed in vagina.  Single-tooth tenaculum was placed on the anterior lip of the cervix.  The uterus sounded to 8 cm and cervix was gently dilated and hysteroscope was introduced into the uterine cavity.  Thickening throughout the endometrium was noted, but no discrete polyp or fibroids seen.  The hysteroscope was removed. The cavities curetted for moderate amount of tissue.  When that was completed, a #8 uterine manipulator was attached to the RUMI applicator and the bivalve speculum was then removed.  Attention then turned to the abdomen.  A 0.25% Marcaine was injected infraumbilically and infraumbilical incision was then made.  Veress needle was introduced and tested.  3 L of CO2 was subsequently insufflated.  The Veress needle was then removed.   A 12 mm disposable trocar with sleeve was introduced into the abdomen without incident.  The robotic camera was then inserted, entry into the abdomen was without incident.  The liver edge was noted to be normal.  Gallbladder was seen.  There was some adhesions noted on the right anterior wall.  The appendix appeared normal, but the cecum had some adhesions extended through that anterior wall as well. Abdominally, there was blood in the abdomen and both tubes were noted to be normal.  The right ovary was free.  The left ovary had a little bit of adherence on the posterior aspect.  There were areas of clear blebs throughout the posterior cul-de-sac and some on the anterior cul-de-sac as well as to the left anterior cul-de-sac region.  Due to the fact that the patient was apparently on her cycle, it was less able to  clearly delineate endometriosis.  On the left, a suprapubic site port was placed with the AirSeal #5 mm and the probe was placed through that port to inspect the pelvis further.  Two additional 5 mm ports were placed under direct visualization in the right and left lower quadrant and using combination of the Nezhat cauterizer as well as the needle point cautery as well as the hot scissors.  A segment of peritoneum was taken out anteriorly for definitive diagnosis of endometriosis  and then posteriorly those areas that appeared to be related to endometriosis was carefully fulgurated. The adhesions on the right were brought down and the abdomen was then irrigated and suctioned.  Bupivacaine was then placed for postop pain management and the port sites were then removed, followed last by the AirSeal which was subsequently removed.  The incisions were then closed with 4-0 Vicryl subcuticular stitches and 0 Vicryl for the fascia in the infraumbilical site area.  The uterine manipulator was removed and good hemostasis noted on the cervix.  SPECIMEN:  Endometrial curetting, anterior  cul-de-sac peritoneal biopsy, sent to Pathology.  ESTIMATED BLOOD LOSS:   15 mL.  COMPLICATION:  None.  The patient tolerated the procedure well, was transferred to recovery in stable condition.     Maxie Better, M.D.     [place]/MEDQ  D:  08/31/2015  T:  09/01/2015  Job:  [id number]

## 2015-09-12 ENCOUNTER — Encounter: Payer: Self-pay | Admitting: Family Medicine

## 2016-08-03 ENCOUNTER — Encounter (HOSPITAL_COMMUNITY): Payer: Self-pay | Admitting: *Deleted

## 2016-08-03 ENCOUNTER — Inpatient Hospital Stay (HOSPITAL_COMMUNITY)
Admission: AD | Admit: 2016-08-03 | Discharge: 2016-08-03 | Disposition: A | Discharge status: Home / Self Care | Payer: Medicaid Other | Source: Ambulatory Visit | Attending: Obstetrics & Gynecology | Admitting: Obstetrics & Gynecology

## 2016-08-03 DIAGNOSIS — O26899 Other specified pregnancy related conditions, unspecified trimester: Secondary | ICD-10-CM

## 2016-08-03 DIAGNOSIS — O0932 Supervision of pregnancy with insufficient antenatal care, second trimester: Secondary | ICD-10-CM | POA: Diagnosis not present

## 2016-08-03 DIAGNOSIS — R1030 Lower abdominal pain, unspecified: Secondary | ICD-10-CM | POA: Insufficient documentation

## 2016-08-03 DIAGNOSIS — O26892 Other specified pregnancy related conditions, second trimester: Secondary | ICD-10-CM | POA: Insufficient documentation

## 2016-08-03 DIAGNOSIS — R102 Pelvic and perineal pain: Secondary | ICD-10-CM

## 2016-08-03 DIAGNOSIS — Z87891 Personal history of nicotine dependence: Secondary | ICD-10-CM | POA: Diagnosis not present

## 2016-08-03 DIAGNOSIS — Z3A24 24 weeks gestation of pregnancy: Secondary | ICD-10-CM | POA: Diagnosis not present

## 2016-08-03 LAB — URINALYSIS, ROUTINE W REFLEX MICROSCOPIC
Bilirubin Urine: NEGATIVE
GLUCOSE, UA: NEGATIVE mg/dL
HGB URINE DIPSTICK: NEGATIVE
KETONES UR: 15 mg/dL — AB
LEUKOCYTES UA: NEGATIVE
Nitrite: NEGATIVE
PH: 6.5 (ref 5.0–8.0)
PROTEIN: NEGATIVE mg/dL
Specific Gravity, Urine: 1.025 (ref 1.005–1.030)

## 2016-08-03 LAB — WET PREP, GENITAL
CLUE CELLS WET PREP: NONE SEEN
Sperm: NONE SEEN
Trich, Wet Prep: NONE SEEN
Yeast Wet Prep HPF POC: NONE SEEN

## 2016-08-03 MED ORDER — PRENATAL PLUS 27-1 MG PO TABS: 1.0000 | ORAL_TABLET | Freq: Every day | ORAL | 0 refills | Status: DC

## 2016-08-03 NOTE — Discharge Instructions (Signed)
Round Ligament Pain  The round ligament is a cord of muscle and tissue that helps to support the uterus. It can become a source of pain during pregnancy if it becomes stretched or twisted as the baby grows. The pain usually begins in the second trimester of pregnancy, and it can come and go until the baby is delivered. It is not a serious problem, and it does not cause harm to the baby.  Round ligament pain is usually a short, sharp, and pinching pain, but it can also be a dull, lingering, and aching pain. The pain is felt in the lower side of the abdomen or in the groin. It usually starts deep in the groin and moves up to the outside of the hip area. Pain can occur with:   A sudden change in position.   Rolling over in bed.   Coughing or sneezing.   Physical activity.  HOME CARE INSTRUCTIONS  Watch your condition for any changes. Take these steps to help with your pain:   When the pain starts, relax. Then try:    Sitting down.    Flexing your knees up to your abdomen.    Lying on your side with one pillow under your abdomen and another pillow between your legs.    Sitting in a warm bath for 15-20 minutes or until the pain goes away.   Take over-the-counter and prescription medicines only as told by your health care provider.   Move slowly when you sit and stand.   Avoid long walks if they cause pain.   Stop or lessen your physical activities if they cause pain.  SEEK MEDICAL CARE IF:   Your pain does not go away with treatment.   You feel pain in your back that you did not have before.   Your medicine is not helping.  SEEK IMMEDIATE MEDICAL CARE IF:   You develop a fever or chills.   You develop uterine contractions.   You develop vaginal bleeding.   You develop nausea or vomiting.   You develop diarrhea.   You have pain when you urinate.     This information is not intended to replace advice given to you by your health care provider. Make sure you discuss any questions you have with your health  care provider.     Document Released: 07/09/2008 Document Revised: 12/23/2011 Document Reviewed: 12/07/2014  [employer] Interactive Patient Education 2016 [employer]

## 2016-08-03 NOTE — MAU Provider Note (Signed)
History     CSN: [id number]  Arrival date and time: 08/03/16 2135   First Provider Initiated Contact with Patient 08/03/16 2234      Chief Complaint  Patient presents with  . Abdominal Pain   Cassandra Mann is a 35 y.o. G4P1021 at 6937w1d presenting with 1 day history of lower abdominal pain. Denies vaginal bleeding, leakage of fluid or irritative vaginal discharge. Good fetal movement. No other known pregnancy problems. History A1 gestational diabetes. NPC except 8 week visit with Dr. Cherly Hensenousins with ultrasound done. Has 6415 month old and was "in denial" about pregnancy, but now accepting and has support. [facility].    Abdominal Pain  This is a new problem. The current episode started yesterday. The onset quality is gradual. The problem occurs intermittently. The problem has been waxing and waning. The pain is located in the suprapubic region. The pain is moderate. The quality of the pain is aching and sharp. The abdominal pain radiates to the back. Pertinent negatives include no constipation, dysuria, frequency or nausea. The pain is aggravated by movement. The pain is relieved by nothing. She has tried nothing for the symptoms. Her past medical history is significant for abdominal surgery.    OB History  Gravida Para Term Preterm AB Living  4 1 1   2 1   SAB TAB Ectopic Multiple Live Births  1 1   1 1     # Outcome Date GA Lbr Len/2nd Weight Sex Delivery Anes PTL Lv  4A Gravida           4B Current           3 Term 04/10/15 2558w5d 11:30 / 00:29 3.09 kg (6 lb 13 oz) M Vag-Spont EPI  LIV  2 SAB 2014          1 TAB 2005               Past Medical History:  Diagnosis Date  . Enlarged thyroid   . Former smoker    Quit- 10/30/14  . Gestational diabetes    diet controlled  . Gestational diabetes mellitus, antepartum   . Headache   . Heart murmur    had surgery at age 89  . Infection    UTI  . Postpartum care following vaginal  delivery (6/27) 04/10/2015    Past Surgical History:  Procedure Laterality Date  . CARDIAC SURGERY     When she was 89 or 35 years old murmur  . DILATATION & CURRETTAGE/HYSTEROSCOPY WITH RESECTOCOPE N/A 08/31/2015   Procedure: DILATATION & CURETTAGE , DIAGNOSTIC HYSTEROSCOPY;  Surgeon: Maxie BetterSheronette Cousins, MD;  Location: [facility];  Service: Gynecology;  Laterality: N/A;  . LAPAROSCOPY N/A 08/31/2015   Procedure: DIAGNOSTIC LAPAROSCOPY ; with fulgeration  of endometriosis  stage 1  peritoneal  biopsy;  Surgeon: Maxie BetterSheronette Cousins, MD;  Location: [facility];  Service: Gynecology;  Laterality: N/A;    Family History  Problem Relation Age of Onset  . Alcohol abuse Maternal Grandmother     scirrosis  . Hypertension Maternal Grandmother   . Diabetes Maternal Grandmother   . Hyperlipidemia Maternal Grandmother   . Heart disease Maternal Grandmother   . Cancer Paternal Grandmother     ovarian  . Diabetes Paternal Grandmother   . Hypertension Mother   . Diabetes Father   . Hypertension Father   . Hyperlipidemia Father   . Diabetes Maternal Grandfather   . Diabetes Paternal Grandfather   .  Hearing loss Neg Hx     Social History  Substance Use Topics  . Smoking status: Former Smoker    Packs/day: 0.25    Years: 5.00    Types: Cigarettes    Quit date: 10/30/2014  . Smokeless tobacco: Never Used     Comment: working on quitting  . Alcohol use No    Allergies:  Allergies  Allergen Reactions  . Latex Itching  . Naproxen Palpitations  . Tramadol Palpitations    Also gives her migraines     Prescriptions Prior to Admission  Medication Sig Dispense Refill Last Dose  . HYDROmorphone (DILAUDID) 4 MG tablet Take 1 tablet (4 mg total) by mouth every 6 (six) hours as needed for severe pain. 30 tablet 0   . Prenatal Vit-Fe Fumarate-FA (PRENATAL MULTIVITAMIN) TABS tablet Take 1 tablet by mouth daily at 12 noon.   04/08/2015 at Unknown time    Review of Systems  Gastrointestinal: Positive for  abdominal pain. Negative for constipation and nausea.  Genitourinary: Negative for dysuria and frequency.   Physical Exam   Blood pressure 119/64, pulse 97, temperature 97.6 F (36.4 C), resp. rate 20, height 5\' 5"  (1.651 m), weight 98.5 kg (217 lb 3.2 oz), currently breastfeeding.  Physical Exam  Nursing note and vitals reviewed. Constitutional: She is oriented to person, place, and time. She appears well-developed and well-nourished. No distress.  HENT:  Head: Normocephalic.  Eyes: Pupils are equal, round, and reactive to light.  Neck: Neck supple.  Cardiovascular: Normal rate.   Respiratory: Effort normal.  GI: Soft. There is tenderness. There is no rebound and no guarding.  FH c/w 24 weeks Mild-moderate suprapubic tenderness, slight bilateral groin tenderness  Genitourinary: Vagina normal. No vaginal discharge found.  Genitourinary Comments: NEFG Speculum exam: Scanty homogenous thin vaginal discharge; cervix pink, multiparous, without lesions Digital: cervix thick, closed, no LUS development    Musculoskeletal: She exhibits no edema.  Neurological: She is alert and oriented to person, place, and time.    MAU Course  Procedures Results for orders placed or performed during the hospital encounter of 08/03/16 (from the past 24 hour(s))  Urinalysis, Routine w reflex microscopic (not at [facility])     Status: Abnormal   Collection Time: 08/03/16  9:50 PM  Result Value Ref Range   Color, Urine YELLOW YELLOW   APPearance CLEAR CLEAR   Specific Gravity, Urine 1.025 1.005 - 1.030   pH 6.5 5.0 - 8.0   Glucose, UA NEGATIVE NEGATIVE mg/dL   Hgb urine dipstick NEGATIVE NEGATIVE   Bilirubin Urine NEGATIVE NEGATIVE   Ketones, ur 15 (A) NEGATIVE mg/dL   Protein, ur NEGATIVE NEGATIVE mg/dL   Nitrite NEGATIVE NEGATIVE   Leukocytes, UA NEGATIVE NEGATIVE  Wet prep, genital     Status: Abnormal   Collection Time: 08/03/16 10:50 PM  Result Value Ref Range   Yeast Wet Prep HPF POC NONE  SEEN NONE SEEN   Trich, Wet Prep NONE SEEN NONE SEEN   Clue Cells Wet Prep HPF POC NONE SEEN NONE SEEN   WBC, Wet Prep HPF POC FEW (A) NONE SEEN   Sperm NONE SEEN    EFM: Baseline 145-150, moderate variability, no decelerations Toco: No contractions  Will discharge home with reassurance, RLP relief measures, pregnancy precautions, increase fluids  Assessment and Plan   1. Pain of round ligament affecting pregnancy, antepartum   2. Insufficient prenatal care in second trimester      Medication List    STOP taking these  medications   HYDROmorphone 4 MG tablet Commonly known as:  DILAUDID     TAKE these medications   prenatal vitamin w/FE, FA 27-1 MG Tabs tablet Take 1 tablet by mouth daily. What changed:  medication strength  when to take this      Follow-up Information    COUSINS,SHERONETTE A, MD. Schedule an appointment as soon as possible for a visit in 1 week(s).   Specialty:  Obstetrics and Gynecology Contact information: [address] [place] [address] [phone]           Cassandra Mann 08/03/2016, 10:35 PM

## 2016-08-03 NOTE — MAU Note (Signed)
Having pain in lower abd since Friday. Sharp, crampy pain which comes and goes. Has not had PNC since July. "I'm in denial" (Unplanned pregnancy). Did see Dr Cherly Hensenousins in July. Denies any bleeding or d/c

## 2016-08-05 LAB — GC/CHLAMYDIA PROBE AMP (~~LOC~~) NOT AT ARMC
Chlamydia: NEGATIVE
Neisseria Gonorrhea: NEGATIVE

## 2016-10-14 NOTE — L&D Delivery Note (Signed)
Patient is 36 y.o. [id number] 4217w2d admitted for IOL for postdates.    Delivery Note At 2:20 AM a viable female was delivered via Vaginal, Spontaneous Delivery (Presentation: Right occiput anterior ). Attempted delivery of anterior shoulder and was unsuccessful with two trials of gentle downward pressure of the head. Verified that there was not a shoulder dystocia. Discovered there was a right compound hand. Attempted delivery of posterior shoulder by grasping right hand and was unsuccessful. Was able to grasp right axilla for successful delivery of posterior shoulder. Approximately 45 seconds passed between delivery of head and delivery of the shoulders; baby was crying while on perineum with good heart tones.   APGAR: 8 ,9 ; weight pending.   Placenta status: intact.  Cord: 3 vessels with the following complications: none.   Anesthesia:  Epidural  Lacerations:  Left Peri-clitoral tear with hemostasis achieved with pressure  Est. Blood Loss (mL):  250   Mom to postpartum.  Baby to Couplet care / Skin to Skin.  De HollingsheadCatherine L Wallace 12/01/2016, 2:41 AM  The Resident attempted delivery, but when anterior shoulder could not be delivered via gentle downward traction x 2 the CNM stepped in and verified that the shoulder was not trapped behind the pubic bone. Delivered posterior arm by hooking axilla of right (posterior) arm. Body followed easily. 45 second delay from delivery of head to delivery of body. Infant crying before delivery, vigorous. Placed skin-to-skin w/ mom. I was present for the placenta and inspection of perineum and agree with above.  [place] Jahfari Ambers, CNM 12/01/2016 8:50 AM

## 2016-11-27 ENCOUNTER — Telehealth: Payer: Self-pay

## 2016-11-27 ENCOUNTER — Inpatient Hospital Stay (HOSPITAL_COMMUNITY)
Admission: AD | Admit: 2016-11-27 | Discharge: 2016-11-27 | Disposition: A | Discharge status: Home / Self Care | Payer: Medicaid Other | Source: Ambulatory Visit | Attending: Obstetrics and Gynecology | Admitting: Obstetrics and Gynecology

## 2016-11-27 ENCOUNTER — Inpatient Hospital Stay (HOSPITAL_COMMUNITY): Payer: Medicaid Other

## 2016-11-27 ENCOUNTER — Encounter (HOSPITAL_COMMUNITY): Payer: Self-pay

## 2016-11-27 DIAGNOSIS — R109 Unspecified abdominal pain: Secondary | ICD-10-CM | POA: Insufficient documentation

## 2016-11-27 DIAGNOSIS — Z87891 Personal history of nicotine dependence: Secondary | ICD-10-CM | POA: Insufficient documentation

## 2016-11-27 DIAGNOSIS — Z3A40 40 weeks gestation of pregnancy: Secondary | ICD-10-CM | POA: Diagnosis not present

## 2016-11-27 DIAGNOSIS — O26893 Other specified pregnancy related conditions, third trimester: Secondary | ICD-10-CM | POA: Insufficient documentation

## 2016-11-27 DIAGNOSIS — O0933 Supervision of pregnancy with insufficient antenatal care, third trimester: Secondary | ICD-10-CM | POA: Insufficient documentation

## 2016-11-27 LAB — URINALYSIS, ROUTINE W REFLEX MICROSCOPIC
Bilirubin Urine: NEGATIVE
Glucose, UA: NEGATIVE mg/dL
HGB URINE DIPSTICK: NEGATIVE
KETONES UR: NEGATIVE mg/dL
Nitrite: NEGATIVE
PROTEIN: 30 mg/dL — AB
Specific Gravity, Urine: 1.021 (ref 1.005–1.030)
pH: 6 (ref 5.0–8.0)

## 2016-11-27 LAB — DIFFERENTIAL
BASOS ABS: 0 10*3/uL (ref 0.0–0.1)
BASOS PCT: 0 %
EOS ABS: 0 10*3/uL (ref 0.0–0.7)
Eosinophils Relative: 1 %
Lymphocytes Relative: 27 %
Lymphs Abs: 2.3 10*3/uL (ref 0.7–4.0)
Monocytes Absolute: 0.7 10*3/uL (ref 0.1–1.0)
Monocytes Relative: 8 %
NEUTROS ABS: 5.6 10*3/uL (ref 1.7–7.7)
NEUTROS PCT: 64 %

## 2016-11-27 LAB — CBC
HCT: 37.4 % (ref 36.0–46.0)
Hemoglobin: 13.1 g/dL (ref 12.0–15.0)
MCH: 29.8 pg (ref 26.0–34.0)
MCHC: 35 g/dL (ref 30.0–36.0)
MCV: 85 fL (ref 78.0–100.0)
Platelets: 304 10*3/uL (ref 150–400)
RBC: 4.4 MIL/uL (ref 3.87–5.11)
RDW: 14.9 % (ref 11.5–15.5)
WBC: 8.7 10*3/uL (ref 4.0–10.5)

## 2016-11-27 LAB — OB RESULTS CONSOLE GBS: STREP GROUP B AG: NEGATIVE

## 2016-11-27 LAB — WET PREP, GENITAL
Clue Cells Wet Prep HPF POC: NONE SEEN
Sperm: NONE SEEN
Trich, Wet Prep: NONE SEEN
YEAST WET PREP: NONE SEEN

## 2016-11-27 LAB — TYPE AND SCREEN
ABO/RH(D): O POS
Antibody Screen: NEGATIVE

## 2016-11-27 LAB — RAPID URINE DRUG SCREEN, HOSP PERFORMED
AMPHETAMINES: NOT DETECTED
BARBITURATES: NOT DETECTED
BENZODIAZEPINES: NOT DETECTED
Cocaine: NOT DETECTED
Opiates: NOT DETECTED
Tetrahydrocannabinol: POSITIVE — AB

## 2016-11-27 LAB — OB RESULTS CONSOLE GC/CHLAMYDIA: GC PROBE AMP, GENITAL: NEGATIVE

## 2016-11-27 NOTE — MAU Provider Note (Signed)
History     CSN: [id number]  Arrival date and time: 11/27/16 1850   First Provider Initiated Contact with Patient 11/27/16 2005      Chief Complaint  Patient presents with  . Abdominal Pain   Cassandra Mann is a 36 y.o. G4P1021 at [redacted]w[redacted]d who presents today with abdominal pain and pressure x 4 days. Diarrhea x 5 today. She feels like it is from something she ate. Denies fever, N/V or sick contacts. She had one prenatal visit in July. She states that she had an [place] at that visit, and was given a due date of 11/22/16. She denies any VB or LOF. She has had some vaginal discharge. She reports normal fetal movement.   Abdominal Pain  This is a new problem. The current episode started in the past 7 days. The onset quality is sudden. The problem occurs intermittently. The problem has been unchanged. The pain is located in the suprapubic region. The pain is at a severity of 7/10. The quality of the pain is cramping. The abdominal pain radiates to the pelvis and perineum. Associated symptoms include diarrhea. Pertinent negatives include no fever, nausea or vomiting. Nothing aggravates the pain. The pain is relieved by nothing. She has tried nothing for the symptoms.    Past Medical History:  Diagnosis Date  . Enlarged thyroid   . Former smoker    Quit- 10/30/14  . Gestational diabetes    diet controlled  . Gestational diabetes mellitus, antepartum   . Headache   . Heart murmur    had surgery at age 60  . Infection    UTI  . Postpartum care following vaginal delivery (6/27) 04/10/2015    Past Surgical History:  Procedure Laterality Date  . CARDIAC SURGERY     When she was 79 or 36 years old murmur  . DILATATION & CURRETTAGE/HYSTEROSCOPY WITH RESECTOCOPE N/A 08/31/2015   Procedure: DILATATION & CURETTAGE , DIAGNOSTIC HYSTEROSCOPY;  Surgeon: Maxie Better, MD;  Location: [facility];  Service: Gynecology;  Laterality: N/A;  . LAPAROSCOPY N/A 08/31/2015   Procedure: DIAGNOSTIC LAPAROSCOPY ; with  fulgeration  of endometriosis  stage 1  peritoneal  biopsy;  Surgeon: Maxie Better, MD;  Location: [facility];  Service: Gynecology;  Laterality: N/A;    Family History  Problem Relation Age of Onset  . Alcohol abuse Maternal Grandmother     scirrosis  . Hypertension Maternal Grandmother   . Diabetes Maternal Grandmother   . Hyperlipidemia Maternal Grandmother   . Heart disease Maternal Grandmother   . Cancer Paternal Grandmother     ovarian  . Diabetes Paternal Grandmother   . Hypertension Mother   . Diabetes Father   . Hypertension Father   . Hyperlipidemia Father   . Diabetes Maternal Grandfather   . Diabetes Paternal Grandfather   . Hearing loss Neg Hx     Social History  Substance Use Topics  . Smoking status: Former Smoker    Packs/day: 0.25    Years: 5.00    Types: Cigarettes    Quit date: 10/30/2014  . Smokeless tobacco: Never Used  . Alcohol use No    Allergies:  Allergies  Allergen Reactions  . Latex Itching  . Naproxen Palpitations  . Tramadol Palpitations    Also gives her migraines     Prescriptions Prior to Admission  Medication Sig Dispense Refill Last Dose  . prenatal vitamin w/FE, FA (PRENATAL 1 + 1) 27-1 MG TABS tablet Take 1 tablet by mouth daily. 30  each 0 11/27/2016 at Unknown time    Review of Systems  Constitutional: Negative for chills and fever.  Gastrointestinal: Positive for abdominal pain and diarrhea. Negative for nausea and vomiting.  Genitourinary: Positive for pelvic pain and vaginal discharge. Negative for vaginal bleeding.   Physical Exam   Blood pressure 123/69, pulse 114, temperature 98.2 F (36.8 C), temperature source Oral, resp. rate 20, SpO2 100 %, currently breastfeeding.  Physical Exam  Nursing note and vitals reviewed. Constitutional: She is oriented to person, place, and time. She appears well-developed and well-nourished. No distress.  HENT:  Head: Normocephalic.  Cardiovascular: Normal rate.   Respiratory:  Effort normal.  GI: Soft. There is no tenderness. There is no rebound.  Genitourinary:  Genitourinary Comments: Cervix: FT/thick/-2   Neurological: She is alert and oriented to person, place, and time.  Skin: Skin is warm and dry.  Psychiatric: She has a normal mood and affect.    FHT: 140, moderate with 15x15 accels, no decels Toco occasional UC MAU Course  Procedures  MDM 2032: D/W Dr. Emelda FearFerguson, will DC home and have patient return Friday for IOL. Will try to get copy of US report from [facility] to verify dates.  Prenatal labs, GC/CT, Wet prep and GBS pending.  BPP 8/8 Offered patient IOL today. No appointments for IOL on Friday. Patient states that she cannot stay today. She would like to come back Friday.   Assessment and Plan   1. No prenatal care in current pregnancy in third trimester   2. Abdominal pain in pregnancy, third trimester   3. [redacted] weeks gestation of pregnancy    DC home Comfort measures reviewed  3rd Trimester precautions  Labor precautions  Fetal kick counts RX: none  Return to MAU as needed   Follow-up Information    [facility].   [facility]  [facility]30/16 for induction  Contact information: [address] [place] [id number] [phone]           Tawnya CrookHogan, Lafe Clerk Donovan 11/27/2016, 8:16 PM

## 2016-11-27 NOTE — Telephone Encounter (Signed)
Dr. Alanda SlimGonfa, did you call this patient? Pt received a call from our office but no message was left. Sunday SpillersSharon T Saunders, CMA

## 2016-11-27 NOTE — MAU Note (Signed)
No Prenatal care.  Peeing a lot for last 3 days and Feeling a lot of pelvic pressure x 1 week. No appetite today.  Baby moving well. No bleeding. No leaking. Watery stool x 5 today.

## 2016-11-27 NOTE — Telephone Encounter (Signed)
I am wondering if [facility] There is a pre-admit encounter for that date in epic by Dr. Vergie LivingPickens.

## 2016-11-27 NOTE — Discharge Instructions (Signed)
Introduction Patient Name: ________________________________________________ Patient Due Date: ____________________ What is a fetal movement count? A fetal movement count is the number of times that you feel your baby move during a certain amount of time. This may also be called a fetal kick count. A fetal movement count is recommended for every pregnant woman. You may be asked to start counting fetal movements as early as week 28 of your pregnancy. Pay attention to when your baby is most active. You may notice your baby's sleep and wake cycles. You may also notice things that make your baby move more. You should do a fetal movement count:  When your baby is normally most active.  At the same time each day. A good time to count movements is while you are resting, after having something to eat and drink. How do I count fetal movements? 1. Find a quiet, comfortable area. Sit, or lie down on your side. 2. Write down the date, the start time and stop time, and the number of movements that you felt between those two times. Take this information with you to your health care visits. 3. For 2 hours, count kicks, flutters, swishes, rolls, and jabs. You should feel at least 10 movements during 2 hours. 4. You may stop counting after you have felt 10 movements. 5. If you do not feel 10 movements in 2 hours, have something to eat and drink. Then, keep resting and counting for 1 hour. If you feel at least 4 movements during that hour, you may stop counting. Contact a health care provider if:  You feel fewer than 4 movements in 2 hours.  Your baby is not moving like he or she usually does. Date: ____________ Start time: ____________ Stop time: ____________ Movements: ____________ Date: ____________ Start time: ____________ Stop time: ____________ Movements: ____________ Date: ____________ Start time: ____________ Stop time: ____________ Movements: ____________ Date: ____________ Start time: ____________  Stop time: ____________ Movements: ____________ Date: ____________ Start time: ____________ Stop time: ____________ Movements: ____________ Date: ____________ Start time: ____________ Stop time: ____________ Movements: ____________ Date: ____________ Start time: ____________ Stop time: ____________ Movements: ____________ Date: ____________ Start time: ____________ Stop time: ____________ Movements: ____________ Date: ____________ Start time: ____________ Stop time: ____________ Movements: ____________ This information is not intended to replace advice given to you by your health care provider. Make sure you discuss any questions you have with your health care provider. Document Released: 10/30/2006 Document Revised: 05/29/2016 Document Reviewed: 11/09/2015 Elsevier Interactive Patient Education  2017 Elsevier Inc.  

## 2016-11-28 ENCOUNTER — Encounter (HOSPITAL_COMMUNITY): Payer: Self-pay | Admitting: *Deleted

## 2016-11-28 ENCOUNTER — Other Ambulatory Visit: Payer: Self-pay | Admitting: Advanced Practice Midwife

## 2016-11-28 ENCOUNTER — Telehealth (HOSPITAL_COMMUNITY): Payer: Self-pay | Admitting: *Deleted

## 2016-11-28 LAB — GC/CHLAMYDIA PROBE AMP (~~LOC~~) NOT AT ARMC
Chlamydia: NEGATIVE
Neisseria Gonorrhea: NEGATIVE

## 2016-11-28 LAB — RPR: RPR: NONREACTIVE

## 2016-11-28 LAB — HIV ANTIBODY (ROUTINE TESTING W REFLEX): HIV Screen 4th Generation wRfx: NONREACTIVE

## 2016-11-28 LAB — RUBELLA SCREEN: Rubella: 3.46 index (ref >0.99)

## 2016-11-28 LAB — HEPATITIS B SURFACE ANTIGEN: HEP B S AG: NEGATIVE

## 2016-11-28 NOTE — Telephone Encounter (Signed)
Informed pt and [facility].Lamonte SakaiZimmerman Rumple, Austina Constantin D, [place]

## 2016-11-28 NOTE — Telephone Encounter (Signed)
Preadmission screen  

## 2016-11-29 ENCOUNTER — Other Ambulatory Visit: Payer: Self-pay | Admitting: Advanced Practice Midwife

## 2016-11-29 LAB — CULTURE, OB URINE

## 2016-11-29 LAB — CULTURE, BETA STREP (GROUP B ONLY)

## 2016-11-30 ENCOUNTER — Inpatient Hospital Stay (HOSPITAL_COMMUNITY)
Admission: AD | Admit: 2016-11-30 | Discharge: 2016-12-02 | DRG: 775 | Disposition: A | Discharge status: Home / Self Care | Payer: Medicaid Other | Source: Ambulatory Visit | Attending: Obstetrics and Gynecology | Admitting: Obstetrics and Gynecology

## 2016-11-30 ENCOUNTER — Encounter (HOSPITAL_COMMUNITY): Payer: Self-pay | Admitting: Anesthesiology

## 2016-11-30 ENCOUNTER — Inpatient Hospital Stay (HOSPITAL_COMMUNITY): Payer: Medicaid Other | Admitting: Anesthesiology

## 2016-11-30 ENCOUNTER — Encounter (HOSPITAL_COMMUNITY): Payer: Self-pay

## 2016-11-30 DIAGNOSIS — Z8249 Family history of ischemic heart disease and other diseases of the circulatory system: Secondary | ICD-10-CM | POA: Diagnosis not present

## 2016-11-30 DIAGNOSIS — Z3A41 41 weeks gestation of pregnancy: Secondary | ICD-10-CM

## 2016-11-30 DIAGNOSIS — O99214 Obesity complicating childbirth: Secondary | ICD-10-CM | POA: Diagnosis present

## 2016-11-30 DIAGNOSIS — Z6839 Body mass index (BMI) 39.0-39.9, adult: Secondary | ICD-10-CM | POA: Diagnosis not present

## 2016-11-30 DIAGNOSIS — Z87891 Personal history of nicotine dependence: Secondary | ICD-10-CM | POA: Diagnosis not present

## 2016-11-30 DIAGNOSIS — K219 Gastro-esophageal reflux disease without esophagitis: Secondary | ICD-10-CM | POA: Diagnosis present

## 2016-11-30 DIAGNOSIS — O9962 Diseases of the digestive system complicating childbirth: Secondary | ICD-10-CM | POA: Diagnosis present

## 2016-11-30 DIAGNOSIS — Z833 Family history of diabetes mellitus: Secondary | ICD-10-CM

## 2016-11-30 DIAGNOSIS — O480 Post-term pregnancy: Secondary | ICD-10-CM | POA: Diagnosis present

## 2016-11-30 LAB — CBC
HEMATOCRIT: 37.3 % (ref 36.0–46.0)
HEMOGLOBIN: 13 g/dL (ref 12.0–15.0)
MCH: 29.3 pg (ref 26.0–34.0)
MCHC: 34.9 g/dL (ref 30.0–36.0)
MCV: 84.2 fL (ref 78.0–100.0)
Platelets: 315 10*3/uL (ref 150–400)
RBC: 4.43 MIL/uL (ref 3.87–5.11)
RDW: 15.1 % (ref 11.5–15.5)
WBC: 9 10*3/uL (ref 4.0–10.5)

## 2016-11-30 LAB — TYPE AND SCREEN
ABO/RH(D): O POS
Antibody Screen: NEGATIVE

## 2016-11-30 MED ORDER — PHENYLEPHRINE 40 MCG/ML (10ML) SYRINGE FOR IV PUSH (FOR BLOOD PRESSURE SUPPORT)
80.0000 ug | PREFILLED_SYRINGE | INTRAVENOUS | Status: DC | PRN
  Filled 2016-11-30: qty 10
  Filled 2016-11-30: qty 5

## 2016-11-30 MED ORDER — EPHEDRINE 5 MG/ML INJ
10.0000 mg | INTRAVENOUS | Status: DC | PRN
  Filled 2016-11-30: qty 4

## 2016-11-30 MED ORDER — OXYTOCIN 40 UNITS IN LACTATED RINGERS INFUSION - SIMPLE MED: 2.5000 [IU]/h | INTRAVENOUS | Status: DC

## 2016-11-30 MED ORDER — DIPHENHYDRAMINE HCL 50 MG/ML IJ SOLN: 12.5000 mg | INTRAMUSCULAR | Status: DC | PRN

## 2016-11-30 MED ORDER — ACETAMINOPHEN 325 MG PO TABS: 650.0000 mg | ORAL_TABLET | ORAL | Status: DC | PRN

## 2016-11-30 MED ORDER — TERBUTALINE SULFATE 1 MG/ML IJ SOLN
0.2500 mg | Freq: Once | INTRAMUSCULAR | Status: DC | PRN
Start: 2016-11-30 — End: 2016-12-01
  Filled 2016-11-30: qty 1

## 2016-11-30 MED ORDER — FENTANYL 2.5 MCG/ML BUPIVACAINE 1/10 % EPIDURAL INFUSION (WH - ANES)
14.0000 mL/h | INTRAMUSCULAR | Status: DC | PRN
  Administered 2016-11-30: 14 mL/h via EPIDURAL
  Filled 2016-11-30: qty 100

## 2016-11-30 MED ORDER — SOD CITRATE-CITRIC ACID 500-334 MG/5ML PO SOLN: 30.0000 mL | ORAL | Status: DC | PRN

## 2016-11-30 MED ORDER — LIDOCAINE HCL (PF) 1 % IJ SOLN
INTRAMUSCULAR | Status: DC | PRN
Start: 2016-11-30 — End: 2016-12-01
  Administered 2016-11-30 (×2): 6 mL via EPIDURAL

## 2016-11-30 MED ORDER — LACTATED RINGERS IV SOLN
500.0000 mL | Freq: Once | INTRAVENOUS | Status: AC
  Administered 2016-11-30: 500 mL via INTRAVENOUS

## 2016-11-30 MED ORDER — LIDOCAINE HCL (PF) 1 % IJ SOLN
30.0000 mL | INTRAMUSCULAR | Status: DC | PRN
  Filled 2016-11-30: qty 30

## 2016-11-30 MED ORDER — ONDANSETRON HCL 4 MG/2ML IJ SOLN
4.0000 mg | Freq: Four times a day (QID) | INTRAMUSCULAR | Status: DC | PRN
  Administered 2016-12-01: 4 mg via INTRAVENOUS
  Filled 2016-11-30: qty 2

## 2016-11-30 MED ORDER — PHENYLEPHRINE 40 MCG/ML (10ML) SYRINGE FOR IV PUSH (FOR BLOOD PRESSURE SUPPORT): 80.0000 ug | PREFILLED_SYRINGE | INTRAVENOUS | Status: DC | PRN

## 2016-11-30 MED ORDER — MISOPROSTOL 25 MCG QUARTER TABLET: 25.0000 ug | ORAL_TABLET | ORAL | Status: DC | PRN

## 2016-11-30 MED ORDER — FENTANYL CITRATE (PF) 100 MCG/2ML IJ SOLN: 100.0000 ug | INTRAMUSCULAR | Status: DC | PRN

## 2016-11-30 MED ORDER — OXYCODONE-ACETAMINOPHEN 5-325 MG PO TABS: 1.0000 | ORAL_TABLET | ORAL | Status: DC | PRN

## 2016-11-30 MED ORDER — PHENYLEPHRINE 40 MCG/ML (10ML) SYRINGE FOR IV PUSH (FOR BLOOD PRESSURE SUPPORT)
80.0000 ug | PREFILLED_SYRINGE | INTRAVENOUS | Status: DC | PRN
  Filled 2016-11-30: qty 5
  Filled 2016-11-30: qty 10

## 2016-11-30 MED ORDER — TERBUTALINE SULFATE 1 MG/ML IJ SOLN
0.2500 mg | Freq: Once | INTRAMUSCULAR | Status: DC | PRN
  Filled 2016-11-30: qty 1

## 2016-11-30 MED ORDER — MISOPROSTOL 50MCG HALF TABLET
50.0000 ug | ORAL_TABLET | ORAL | Status: DC | PRN
  Administered 2016-11-30 (×2): 50 ug via ORAL
  Filled 2016-11-30 (×2): qty 0.5

## 2016-11-30 MED ORDER — OXYCODONE-ACETAMINOPHEN 5-325 MG PO TABS: 2.0000 | ORAL_TABLET | ORAL | Status: DC | PRN

## 2016-11-30 MED ORDER — OXYTOCIN BOLUS FROM INFUSION
500.0000 mL | Freq: Once | INTRAVENOUS | Status: AC
  Administered 2016-12-01: 500 mL via INTRAVENOUS

## 2016-11-30 MED ORDER — EPHEDRINE 5 MG/ML INJ: 10.0000 mg | INTRAVENOUS | Status: DC | PRN

## 2016-11-30 MED ORDER — LACTATED RINGERS IV SOLN: 500.0000 mL | INTRAVENOUS | Status: DC | PRN

## 2016-11-30 MED ORDER — LACTATED RINGERS IV SOLN: 500.0000 mL | Freq: Once | INTRAVENOUS | Status: DC

## 2016-11-30 MED ORDER — OXYTOCIN 40 UNITS IN LACTATED RINGERS INFUSION - SIMPLE MED
1.0000 m[IU]/min | INTRAVENOUS | Status: DC
  Administered 2016-11-30: 2 m[IU]/min via INTRAVENOUS
  Filled 2016-11-30: qty 1000

## 2016-11-30 MED ORDER — LACTATED RINGERS IV SOLN
INTRAVENOUS | Status: DC
  Administered 2016-11-30 (×2): via INTRAVENOUS

## 2016-11-30 MED ORDER — EPHEDRINE 5 MG/ML INJ
10.0000 mg | INTRAVENOUS | Status: DC | PRN
Start: 2016-11-30 — End: 2016-11-30

## 2016-11-30 NOTE — H&P (Signed)
Cassandra Mann is a 36 y.o. female presenting for induction of labor for post dates Has not gotten prenatal care due to losing her job and insurance. Called several practices and could not get in Last baby a year or so ago with Cassandra Mann. .Had GDM last pregnancy.  OB History    Gravida Para Term Preterm AB Living   4 1 1   2 1    SAB TAB Ectopic Multiple Live Births   1 1   1 1      Past Medical History:  Diagnosis Date  . Enlarged thyroid   . Family history of PDA (patent ductus arteriosus)    repaired at 36 years of age  . Former smoker    Quit- 10/30/14  . Gestational diabetes    diet controlled  . Gestational diabetes mellitus, antepartum   . Headache   . Heart murmur    had surgery at age 32  . Infection    UTI  . Postpartum care following vaginal delivery (6/27) 04/10/2015   Past Surgical History:  Procedure Laterality Date  . CARDIAC SURGERY     When she was 38 or 36 years old murmur  . DILATATION & CURRETTAGE/HYSTEROSCOPY WITH RESECTOCOPE N/A 08/31/2015   Procedure: DILATATION & CURETTAGE , DIAGNOSTIC HYSTEROSCOPY;  Surgeon: Cassandra Better, MD;  Location: [facility];  Service: Gynecology;  Laterality: N/A;  . LAPAROSCOPY N/A 08/31/2015   Procedure: DIAGNOSTIC LAPAROSCOPY ; with fulgeration  of endometriosis  stage 1  peritoneal  biopsy;  Surgeon: Cassandra Better, MD;  Location: [facility];  Service: Gynecology;  Laterality: N/A;   Family History: family history includes Alcohol abuse in her maternal grandmother; Cancer in her paternal grandmother; Diabetes in her father, maternal grandfather, maternal grandmother, paternal grandfather, and paternal grandmother; Heart disease in her maternal grandmother; Hyperlipidemia in her father and maternal grandmother; Hypertension in her father, maternal grandmother, and mother. Social History:  reports that she quit smoking about 2 years ago. Her smoking use included Cigarettes. She has a 1.25 pack-year smoking history. She has never used  smokeless tobacco. She reports that she does not drink alcohol or use drugs.     Maternal Diabetes: No Genetic Screening:  Maternal Ultrasounds/Referrals: Declined Fetal Ultrasounds or other Referrals:  None Maternal Substance Abuse:  No Significant Maternal Medications:  None Significant Maternal Lab Results:  Lab values include: Group B Strep negative Other Comments:  No prenatal care due to job loss, unable to get an appt  Review of Systems  Constitutional: Negative for chills, fever and malaise/fatigue.  Eyes: Negative for blurred vision.  Respiratory: Negative for shortness of breath.   Gastrointestinal: Negative for abdominal pain, constipation, diarrhea, nausea and vomiting.  Neurological: Negative for dizziness.   Maternal Medical History:  Reason for admission: Nausea. IOL   Contractions: Onset was 3-5 hours ago.   Frequency: irregular.   Perceived severity is mild.    Fetal activity: Perceived fetal activity is normal.   Last perceived fetal movement was within the past hour.    Prenatal complications: No bleeding, PIH, placental abnormality, pre-eclampsia or preterm labor.   Prenatal Complications - Diabetes: none.    Dilation: Closed Effacement (%): Thick Station: Ballotable Exam by:: S Nix RN Height 5\' 5"  (1.651 m), weight 240 lb (108.9 kg), currently breastfeeding. Maternal Exam:  Uterine Assessment: Contraction strength is mild.  Contraction frequency is irregular.   Abdomen: Patient reports no abdominal tenderness. Fundal height is 37.   Fetal presentation: vertex  Introitus: Normal vulva.  Normal vagina.  Ferning test: not done.  Nitrazine test: not done. Amniotic fluid character: not assessed.  Pelvis: adequate for delivery.   Cervix: Cervix evaluated by digital exam.     Fetal Exam Fetal Monitor Review: Mode: ultrasound.   Baseline rate: 140.      Physical Exam  Prenatal labs: ABO, Rh: --/--/O POS (02/17 1029) Antibody: PENDING  (02/17 1029) Rubella: 3.46 (02/14 2043) RPR: Non Reactive (02/14 2043)  HBsAg: Negative (02/14 2043)  HIV: Non Reactive (02/14 2043)  GBS:     Assessment/Plan: SIUP at 847w1d Induction of labor for postdates  [facility] Cytotec for ripening   Cassandra Mann 11/30/2016, 11:11 AM

## 2016-11-30 NOTE — Progress Notes (Signed)
Cassandra Mann is a 36 y.o. [phone] at 1186w1d by ultrasound admitted for induction of labor due to Post dates.   Subjective: Patient is resting comfortably. Not currently feeling contractions  Objective: BP 116/74   Pulse 95   Temp 98 F (36.7 C) (Oral)   Resp 20   Ht 5\' 5"  (1.651 m)   Wt 108.9 kg (240 lb)   LMP  (LMP Unknown)   BMI 39.94 kg/m  No intake/output data recorded. No intake/output data recorded.  FHT:  FHR: 130 bpm, variability: moderate,  accelerations:  Present,  decelerations:  Absent UC:   irregular, every 5-8 minutes SVE:   Dilation: Closed Effacement (%): Thick Station: Ballotable Exam by:: Wynelle BourgeoisMarie Shenita Mann CNM  Labs: Lab Results  Component Value Date   WBC 9.0 11/30/2016   HGB 13.0 11/30/2016   HCT 37.3 11/30/2016   MCV 84.2 11/30/2016   PLT 315 11/30/2016    Assessment / Plan: Induction of labor due to post dats,  progressing well on pitocin  Labor: not yet in labor Preeclampsia:  no signs or symptoms of toxicity Fetal Wellbeing:  Category I Pain Control:  Labor support without medications I/D:  n/a Anticipated MOD:  NSVD  [name] 11/30/2016, 3:30 PM  Seen and examined Agree with note Aviva SignsMarie L Cyril Woodmansee, CNM

## 2016-11-30 NOTE — Anesthesia Procedure Notes (Signed)
Epidural Patient location during procedure: OB Start time: 11/30/2016 8:51 PM End time: 11/30/2016 9:16 PM  Staffing Anesthesiologist: Jairo BenJACKSON, Sameen Leas Performed: anesthesiologist   Preanesthetic Checklist Completed: patient identified, surgical consent, pre-op evaluation, timeout performed, IV checked, risks and benefits discussed and monitors and equipment checked  Epidural Patient position: sitting Prep: site prepped and draped and DuraPrep Patient monitoring: blood pressure, continuous pulse ox and heart rate Approach: midline Location: L3-L4 Injection technique: LOR air  Needle:  Needle type: Tuohy  Needle gauge: 17 G Needle length: 9 cm Needle insertion depth: 8.5 cm Catheter type: closed end flexible Catheter size: 19 Gauge Catheter at skin depth: 14 cm Test dose: negative (1% Lidocaine)  Assessment Events: blood not aspirated, injection not painful, no injection resistance, negative IV test and no paresthesia  Additional Notes Pt identified in Labor room.  Monitors applied. Working IV access confirmed. Sterile prep, drape lumbar spine.  1% lido local L 3,4.  #17ga Touhy LOR air at 8.5 cm L 3,4, cath in easily to 14 cm skin. Test dose OK, cath dosed and infusion begun.  Patient asymptomatic, VSS, no heme aspirated, tolerated well.  Cassandra Craze Riva Sesma, MD

## 2016-11-30 NOTE — Progress Notes (Signed)
Kerri Percheslexis C Witherell is a 36 y.o. G4P1021 at 5313w1d IOL for postdates.  Subjective: Patient has received epidural and now feels very comfortable. Not feeling contractions.   Objective: BP 116/66   Pulse 97   Temp 98.7 F (37.1 C) (Oral)   Resp 14   Ht 5\' 5"  (1.651 m)   Wt 108.9 kg (240 lb)   LMP  (LMP Unknown)   SpO2 98%   BMI 39.94 kg/m    FHT:  FHR: 140 bpm, variability: good,  accelerations:  15x15,  decelerations:  none UC:   Q 1-4 minutes Dilation: 4 Effacement (%): 60 Cervical Position: Middle Station: -2 Presentation: Vertex Exam by:: Margret ChanceNora Weatherby, RN  Labs: Results for orders placed or performed during the hospital encounter of 11/30/16 (from the past 24 hour(s))  CBC     Status: None   Collection Time: 11/30/16 10:29 AM  Result Value Ref Range   WBC 9.0 4.0 - 10.5 K/uL   RBC 4.43 3.87 - 5.11 MIL/uL   Hemoglobin 13.0 12.0 - 15.0 g/dL   HCT 19.137.3 47.836.0 - 29.546.0 %   MCV 84.2 78.0 - 100.0 fL   MCH 29.3 26.0 - 34.0 pg   MCHC 34.9 30.0 - 36.0 g/dL   RDW 62.115.1 30.811.5 - 65.715.5 %   Platelets 315 150 - 400 K/uL  Type and screen     Status: None   Collection Time: 11/30/16 10:29 AM  Result Value Ref Range   ABO/RH(D) O POS    Antibody Screen NEG    Sample Expiration 12/03/2016     Assessment / Plan: 1113w1d week IUP Labor: On pitocin.  Fetal Wellbeing:  Category I Pain Control:  Epidural Anticipated MOD:  SVD   Marcy Sirenatherine Wallace, D.O. 11/30/2016, 10:14 PM PGY-2, [facility]

## 2016-11-30 NOTE — Anesthesia Pain Management Evaluation Note (Signed)
  CRNA Pain Management Visit Note  Patient: Cassandra Mann, 36 y.o., female  "Hello I am a member of the anesthesia team at [facility]. We have an anesthesia team available at all times to provide care throughout the hospital, including epidural management and anesthesia for C-section. I don't know your plan for the delivery whether it a natural birth, water birth, IV sedation, nitrous supplementation, doula or epidural, but we want to meet your pain goals."   1.Was your pain managed to your expectations on prior hospitalizations?   Yes   2.What is your expectation for pain management during this hospitalization?     Epidural  3.How can we help you reach that goal? unsure  Record the patient's initial score and the patient's pain goal.   Pain: 4  Pain Goal: 8 [facility] wants you to be able to say your pain was always managed very well.  Cephus ShellingBURGER,Janvi Ammar 11/30/2016

## 2016-11-30 NOTE — Anesthesia Preprocedure Evaluation (Signed)
Anesthesia Evaluation  Patient identified by MRN, date of birth, ID band Patient awake    Reviewed: Allergy & Precautions, NPO status , Patient's Chart, lab work & pertinent test results  History of Anesthesia Complications Negative for: history of anesthetic complications  Airway Mallampati: II  TM Distance: >3 FB Neck ROM: Full    Dental  (+) Dental Advisory Given   Pulmonary former smoker,    breath sounds clear to auscultation       Cardiovascular + Valvular Problems/Murmurs (h/o L thoracotomy for PDA closure as a child)  Rhythm:Regular Rate:Normal     Neuro/Psych  Headaches,    GI/Hepatic Neg liver ROS, GERD  ,  Endo/Other  diabetes (diet controlled)Morbid obesity  Renal/GU negative Renal ROS     Musculoskeletal   Abdominal (+) + obese,   Peds  Hematology plt 315k   Anesthesia Other Findings   Reproductive/Obstetrics                             Anesthesia Physical Anesthesia Plan  ASA: III  Anesthesia Plan: Epidural   Post-op Pain Management:    Induction:   Airway Management Planned: Natural Airway  Additional Equipment:   Intra-op Plan:   Post-operative Plan:   Informed Consent: I have reviewed the patients History and Physical, chart, labs and discussed the procedure including the risks, benefits and alternatives for the proposed anesthesia with the patient or authorized representative who has indicated his/her understanding and acceptance.   Dental advisory given  Plan Discussed with:   Anesthesia Plan Comments: (Patient identified. Risks/Benefits/Options discussed with patient including but not limited to bleeding, infection, nerve damage, paralysis, failed block, incomplete pain control, headache, blood pressure changes, nausea, vomiting, reactions to medication both or allergic, itching and postpartum back pain. Confirmed with bedside nurse the patient's most  recent platelet count. Confirmed with patient that they are not currently taking any anticoagulation, have any bleeding history or any family history of bleeding disorders. Patient expressed understanding and wished to proceed. All questions were answered. )        Anesthesia Quick Evaluation

## 2016-11-30 NOTE — Progress Notes (Signed)
Patient ID: Cassandra Mann, female   DOB: October 29, 1980, 36 y.o.   MRN: [id number] Doing well Vitals:   11/30/16 0924 11/30/16 1133 11/30/16 1807 11/30/16 1930  BP:  116/74 124/70 127/72  Pulse:  95 91 80  Resp:  20 18 16   Temp:  98 F (36.7 C) 98.3 F (36.8 C) 98.7 F (37.1 C)  TempSrc:  Oral Oral Oral  Weight: 240 lb (108.9 kg)     Height: 5\' 5"  (1.651 m)      FHR reactive Cat I UCs q 6min  Dilation: 3 Effacement (%): 60 Cervical Position: Middle Station: -3 Presentation: Vertex Exam by:: S Nix RN  Will start Pitocin

## 2016-11-30 NOTE — Progress Notes (Signed)
Kerri Percheslexis C Marrow is a 36 y.o. [phone] at 639w1d by ultrasound admitted for induction of labor due to Post dates. Due date 11/29/16.  Subjective:   Objective: BP 116/74   Pulse 95   Temp 98 F (36.7 C) (Oral)   Resp 20   Ht 5\' 5"  (1.651 m)   Wt 108.9 kg (240 lb)   LMP  (LMP Unknown)   BMI 39.94 kg/m  No intake/output data recorded. No intake/output data recorded.  FHT:  FHR: 140 bpm, variability: moderate,  accelerations:  Present,  decelerations:  Absent UC:   irregular, every 3-5 minutes SVE:   Dilation: Closed Effacement (%): Thick Station: Ballotable Exam by:: Wynelle BourgeoisMarie Maleak Brazzel CNM  Labs: Lab Results  Component Value Date   WBC 9.0 11/30/2016   HGB 13.0 11/30/2016   HCT 37.3 11/30/2016   MCV 84.2 11/30/2016   PLT 315 11/30/2016    Assessment / Plan: Induction of labor due to post dates,  progressing well on pitocin  Labor: not yet in labor, will continue with cytotec cervical ripening Preeclampsia:  no signs or symptoms of toxicity Fetal Wellbeing:  Category I Pain Control:  Labor support without medications I/D:  n/a Anticipated MOD:  NSVD  [name] 11/30/2016, 1:45 PM Seen and agree with note Aviva SignsMarie L Dsean Vantol, CNM

## 2016-12-01 ENCOUNTER — Encounter (HOSPITAL_COMMUNITY): Payer: Self-pay

## 2016-12-01 DIAGNOSIS — Z3A41 41 weeks gestation of pregnancy: Secondary | ICD-10-CM

## 2016-12-01 LAB — HEMOGLOBIN A1C
HEMOGLOBIN A1C: 6 % — AB (ref 4.8–5.6)
MEAN PLASMA GLUCOSE: 126 mg/dL

## 2016-12-01 LAB — RPR: RPR: NONREACTIVE

## 2016-12-01 MED ORDER — WITCH HAZEL-GLYCERIN EX PADS: 1.0000 "application " | MEDICATED_PAD | CUTANEOUS | Status: DC | PRN

## 2016-12-01 MED ORDER — SIMETHICONE 80 MG PO CHEW: 80.0000 mg | CHEWABLE_TABLET | ORAL | Status: DC | PRN

## 2016-12-01 MED ORDER — OXYCODONE-ACETAMINOPHEN 5-325 MG PO TABS: 1.0000 | ORAL_TABLET | ORAL | Status: DC | PRN

## 2016-12-01 MED ORDER — OXYCODONE-ACETAMINOPHEN 5-325 MG PO TABS: 2.0000 | ORAL_TABLET | ORAL | Status: DC | PRN

## 2016-12-01 MED ORDER — COCONUT OIL OIL: 1.0000 "application " | TOPICAL_OIL | Status: DC | PRN

## 2016-12-01 MED ORDER — ONDANSETRON HCL 4 MG/2ML IJ SOLN: 4.0000 mg | INTRAMUSCULAR | Status: DC | PRN

## 2016-12-01 MED ORDER — DIBUCAINE 1 % RE OINT: 1.0000 "application " | TOPICAL_OINTMENT | RECTAL | Status: DC | PRN

## 2016-12-01 MED ORDER — TETANUS-DIPHTH-ACELL PERTUSSIS 5-2.5-18.5 LF-MCG/0.5 IM SUSP
0.5000 mL | Freq: Once | INTRAMUSCULAR | Status: DC
Start: 2016-12-02 — End: 2016-12-02

## 2016-12-01 MED ORDER — ACETAMINOPHEN 325 MG PO TABS
650.0000 mg | ORAL_TABLET | ORAL | Status: DC | PRN
  Administered 2016-12-01 (×2): 650 mg via ORAL
  Filled 2016-12-01 (×2): qty 2

## 2016-12-01 MED ORDER — PRENATAL MULTIVITAMIN CH
1.0000 | ORAL_TABLET | Freq: Every day | ORAL | Status: DC
  Administered 2016-12-01: 1 via ORAL
  Filled 2016-12-01 (×2): qty 1

## 2016-12-01 MED ORDER — BENZOCAINE-MENTHOL 20-0.5 % EX AERO: 1.0000 "application " | INHALATION_SPRAY | CUTANEOUS | Status: DC | PRN

## 2016-12-01 MED ORDER — FERROUS SULFATE 325 (65 FE) MG PO TABS
325.0000 mg | ORAL_TABLET | Freq: Two times a day (BID) | ORAL | Status: DC
  Filled 2016-12-01 (×2): qty 1

## 2016-12-01 MED ORDER — DIPHENHYDRAMINE HCL 25 MG PO CAPS: 25.0000 mg | ORAL_CAPSULE | Freq: Four times a day (QID) | ORAL | Status: DC | PRN

## 2016-12-01 MED ORDER — ONDANSETRON HCL 4 MG PO TABS: 4.0000 mg | ORAL_TABLET | ORAL | Status: DC | PRN

## 2016-12-01 MED ORDER — IBUPROFEN 600 MG PO TABS
600.0000 mg | ORAL_TABLET | Freq: Four times a day (QID) | ORAL | Status: DC
  Administered 2016-12-01 – 2016-12-02 (×4): 600 mg via ORAL
  Filled 2016-12-01 (×5): qty 1

## 2016-12-01 MED ORDER — ZOLPIDEM TARTRATE 5 MG PO TABS: 5.0000 mg | ORAL_TABLET | Freq: Every evening | ORAL | Status: DC | PRN

## 2016-12-01 MED ORDER — MAGNESIUM HYDROXIDE 400 MG/5ML PO SUSP: 30.0000 mL | ORAL | Status: DC | PRN

## 2016-12-01 MED ORDER — MEASLES, MUMPS & RUBELLA VAC ~~LOC~~ INJ
0.5000 mL | INJECTION | Freq: Once | SUBCUTANEOUS | Status: DC
  Filled 2016-12-01: qty 0.5

## 2016-12-01 NOTE — Lactation Note (Signed)
This note was copied from a baby's chart. Lactation Consultation Note; Initial visit with mom, baby now 2412 hours old. Mom reports she has been feeding a lot. LS 9-10 by RN. Several family member present. BF brochure given. Reviewed our phone number, OP appointments and BFSG as resources for support after DC. No questions at present.   Patient Name: Cassandra Mann Today's Date: 12/01/2016 Reason for consult: Initial assessment   Maternal Data Formula Feeding for Exclusion: No Does the patient have breastfeeding experience prior to this delivery?: Yes  Feeding Feeding Type: Breast Fed  LATCH Score/Interventions                      Lactation Tools Discussed/Used     Consult Status Consult Status: PRN    Pamelia HoitWeeks, Marketta Valadez D 12/01/2016, 2:36 PM

## 2016-12-01 NOTE — Anesthesia Postprocedure Evaluation (Signed)
Anesthesia Post Note  Patient: Cassandra Mann  Procedure(s) Performed: * No procedures listed *  Patient location during evaluation: Mother Baby Anesthesia Type: Epidural Level of consciousness: awake and alert Pain management: pain level controlled Vital Signs Assessment: post-procedure vital signs reviewed and stable Respiratory status: spontaneous breathing, nonlabored ventilation and respiratory function stable Cardiovascular status: stable Postop Assessment: no headache, no backache, epidural receding and patient able to bend at knees Anesthetic complications: no        Last Vitals:  Vitals:   12/01/16 0658 12/01/16 1004  BP: 111/64 125/69  Pulse: 88 93  Resp: 18   Temp: 36.9 C 36.6 C    Last Pain:  Vitals:   12/01/16 1207  TempSrc:   PainSc: 0-No pain   Pain Goal:                 Rica RecordsICKELTON,Adeli Frost

## 2016-12-02 DIAGNOSIS — Z3A41 41 weeks gestation of pregnancy: Secondary | ICD-10-CM

## 2016-12-02 MED ORDER — NORETHINDRONE 0.35 MG PO TABS: 1.0000 | ORAL_TABLET | Freq: Every day | ORAL | 11 refills | Status: DC

## 2016-12-02 MED ORDER — IBUPROFEN 600 MG PO TABS: 600.0000 mg | ORAL_TABLET | Freq: Four times a day (QID) | ORAL | 0 refills | Status: DC

## 2016-12-02 NOTE — Discharge Instructions (Signed)

## 2016-12-02 NOTE — Clinical Social Work Maternal (Signed)
CLINICAL SOCIAL WORK MATERNAL/CHILD NOTE  Patient Details  Name: Cassandra Mann MRN: [id number] Date of Birth: August 13, 1981  Date:  12/02/2016  Clinical Social Worker Initiating Note:  Laurey Arrow Date/ Time Initiated:  12/02/16/1341     Child's Name:  Cassandra Mann   Legal Guardian:  (S) Father (FOB is Lattie Corns 03/30/1982)   Need for Interpreter:  None   Date of Referral:  12/02/16     Reason for Referral:  Current Substance Use/Substance Use During Pregnancy  (Hx of THC use. MOB UDS was positive on 11/27/16.)   Referral Source:  [facility]   Address:  [place] [address]  Phone number:  [phone]   Household Members:  Self, Minor Children, Significant Other   Natural Supports (not living in the home):  Immediate Family, Extended Family (Family will also receive support from MOB's and FOB's sisters and parents.  )   Professional Supports: None   Employment: Unemployed   Type of Work:     Education:      Pensions consultant:  [employer]   Other Resources:  Physicist, medical    Cultural/Religious Considerations Which May Impact Care:  Per [employer], MOB is Engineer, manufacturing.   Strengths:  Ability to meet basic needs , Home prepared for child    Risk Factors/Current Problems:  Substance Use    Cognitive State:  Alert , Able to Concentrate , Linear Thinking , Insightful    Mood/Affect:  Anxious , Interested    CSW Assessment: CSW met with MOB to complete an assessment for hx THC.  MOB was receptive to meeting with CSW.  When CSW arrived, MOB was changing infant's clothes and FOB Lattie Corns 03/30/1982) was observing MOB and infant's interactions.  MOB gave CSW permission to complete clinical assessment while FOB was present. FOB did not engage with CSW during assessment and demonstrated little to no eye contact. CSW inquired about MOB's substance use hx, and MOB acknowledged the use of marijuana and reported MOB has  engaged in edibles throughout pregnancy.  MOB reported MOB last edible was about 1 week ago.MOB was unable to rationalize MOB's THC use during pregnancy. MOB denied the use of any other substance during pregnancy. CSW informed MOB of the hospital's drug screen policy, and informed MOB of the 2 screenings for the infant. MOB appeared understanding and did not have any questions about the hospital policy and procedure. CSW shared with MOB that the infant's UDS was negative and CSW will continue to monitor the infant's CDS. CSW made MOB aware that if the infant's CDS is positive without an explanation, CSW will make a report to [facility] CPS.  CSW offered MOB resources and referrals for SA, and MOB declined.  CSW educated MOB about PPD. CSW informed MOB of possible supports and interventions to decrease PPD.  CSW also encouraged MOB to seek medical attention if needed for increased signs and symptoms for PPD. CSW reviewed safe sleep, and SIDS. MOB was knowledgeable and asked appropriate questions.  MOB communicated that she has a co-sleeper for the baby, and feels prepared for the infant.  MOB did not have any further questions, concerns, or needs at this time. CSW thanked MOB for meeting with CSW and provided MOB with CSW contact information.  CSW Plan/Description:  Psychosocial Support and Ongoing Assessment of Needs, Patient/Family Education , No Further Intervention Required/No Barriers to Discharge (CSW will follow infant's CDS and will make a report if needed. )  Rhyatt Muska D Cheema-GILYARD, LCSW 12/02/2016, 1:45 PM

## 2016-12-02 NOTE — Discharge Summary (Signed)
Obstetric Discharge Summary Reason for Admission: term labor Prenatal Procedures: no prenatal care Intrapartum Procedures: spontaneous vaginal delivery Postpartum Procedures: none Complications-Operative and Postpartum: none Hemoglobin  Date Value Ref Range Status  11/30/2016 13.0 12.0 - 15.0 g/dL Final   HCT  Date Value Ref Range Status  11/30/2016 37.3 36.0 - 46.0 % Final    Physical Exam:  General: alert Lochia: appropriate Uterine Fundus: firm and NT at U-2 DVT Evaluation: No evidence of DVT seen on physical exam.  Discharge Diagnoses: Term Pregnancy-delivered  Discharge Information: Date: 12/02/2016 Activity: pelvic rest Diet: routine Medications: PNV and Ibuprofen, POPs Condition: stable Instructions: refer to practice specific booklet Discharge to: home   Follow-up Information    Catalina AntiguaPeggy Constant, MD. Schedule an appointment as soon as possible for a visit in 6 week(s).   Specialty:  Obstetrics and Gynecology Contact information: [address] [place] [address] [phone]           Newborn Data: Live born female  Birth Weight: 6 lb 15.5 oz (3161 g) APGAR: 8, 9  Home with mother.  Cassandra Mann 12/02/2016, 7:43 AM

## 2017-04-02 IMAGING — US US MFM FETAL BPP W/O NON-STRESS
1 series · 13 of 14 positions shown · non-contrast
Comparison: none

[Series 1: us mfm fetal bpp w/o non-stress · 14 acquisitions, 13 frames shown]
[im 1/14]
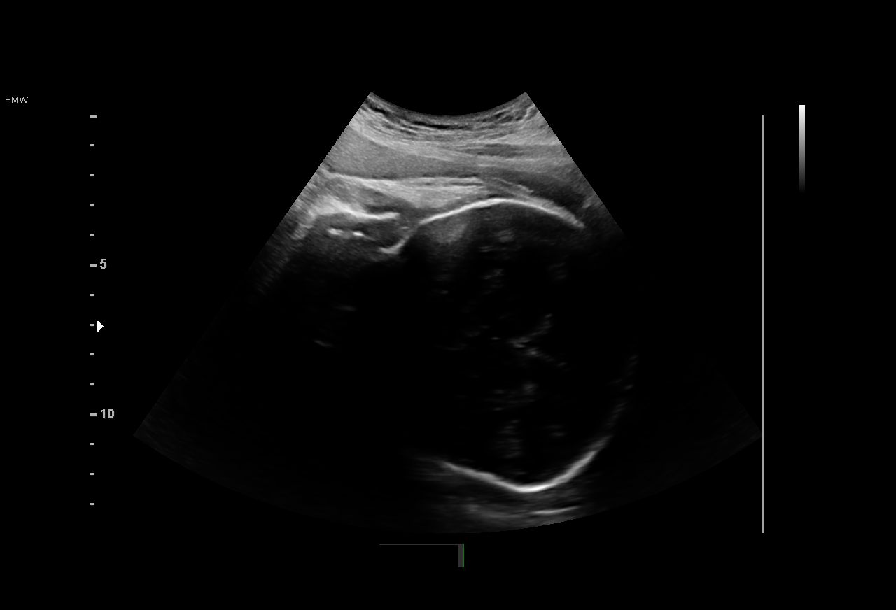
[im 2/14]
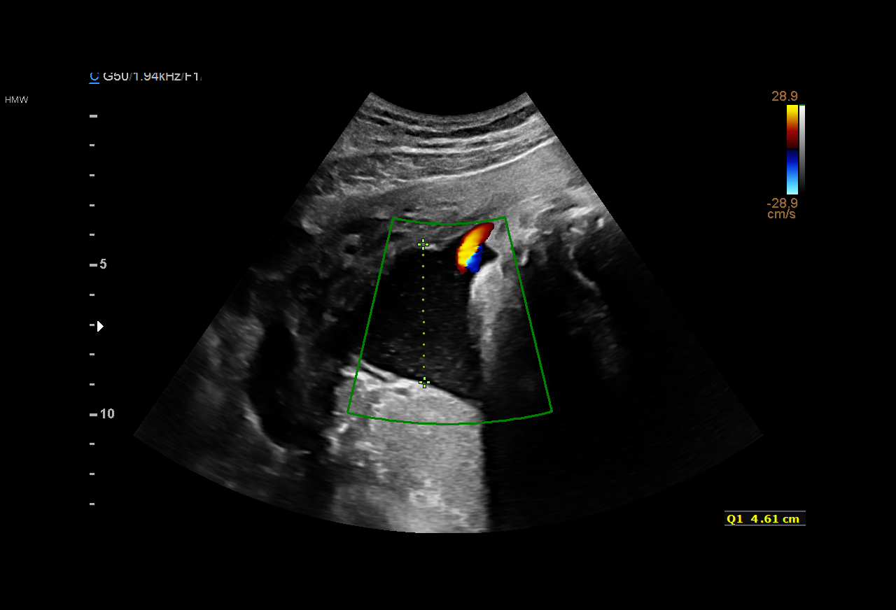
[im 3/14]
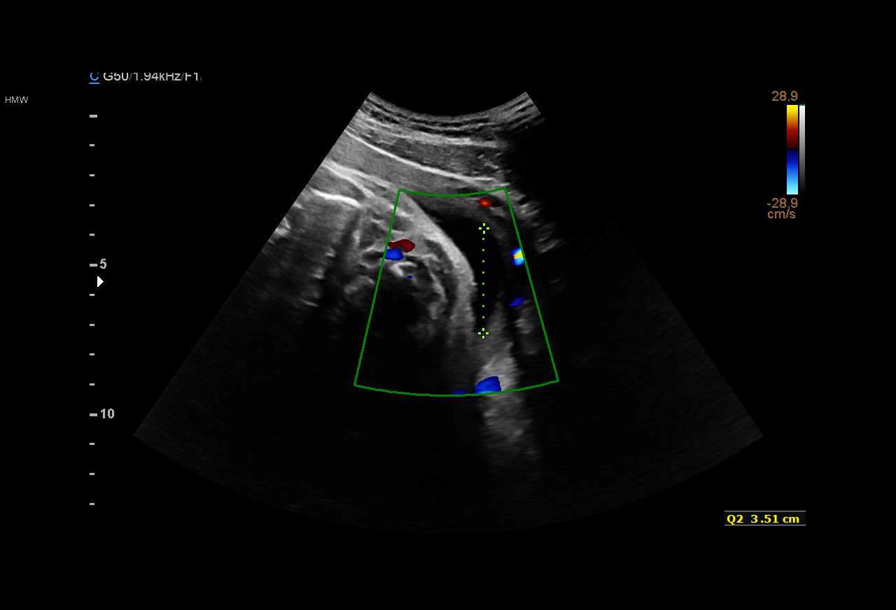
[im 4/14]
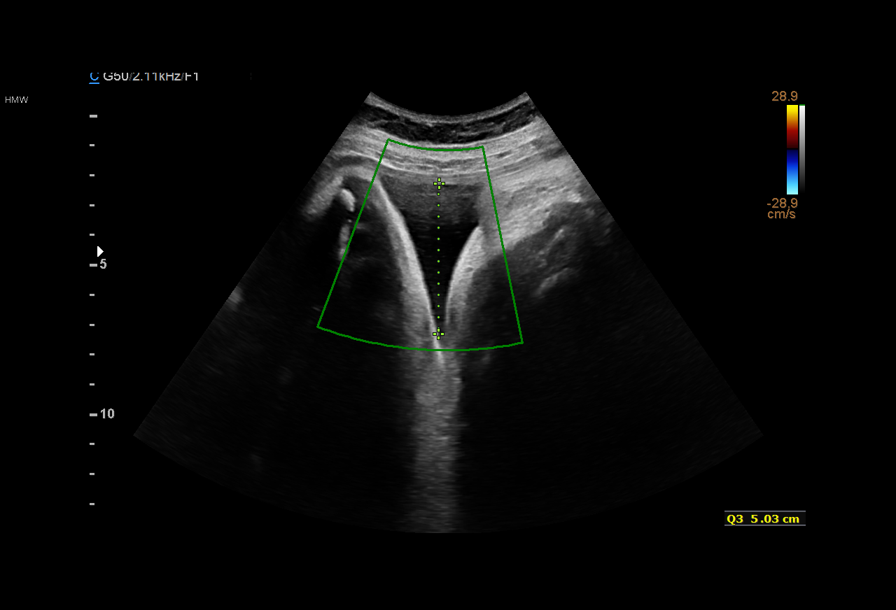
[im 5/14]
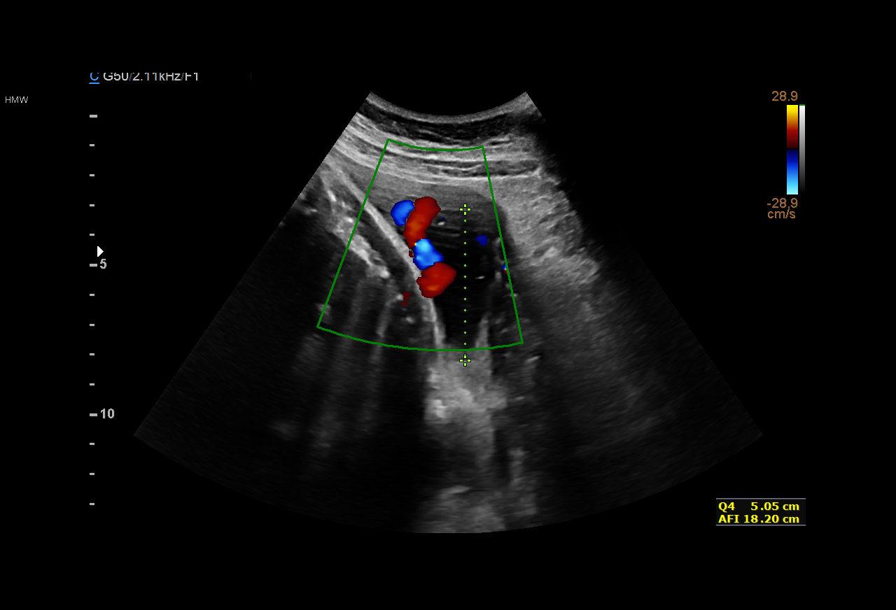
[im 6/14]
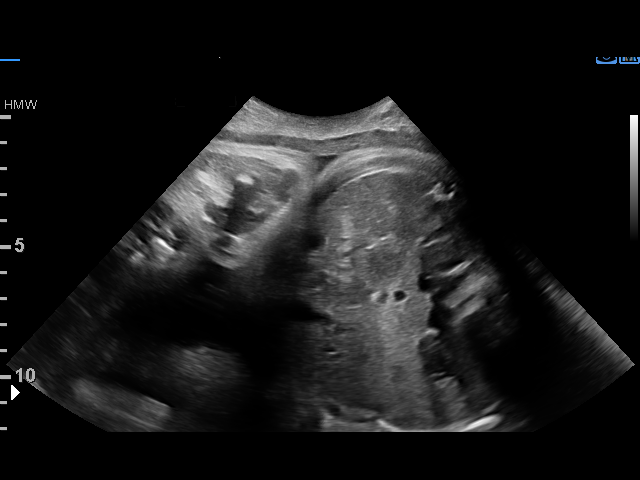
[im 8/14]
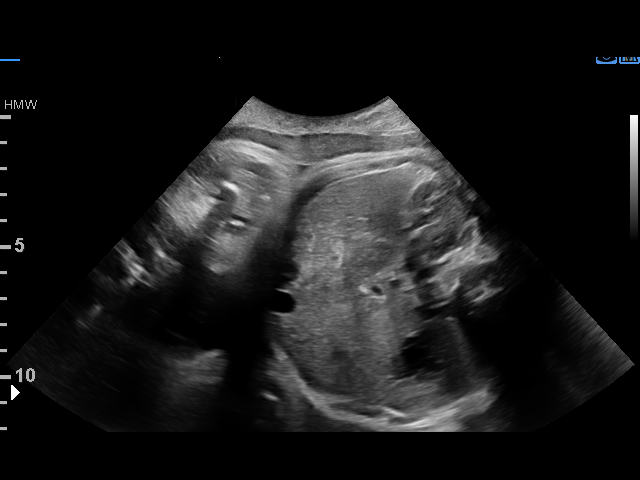
[im 9/14]
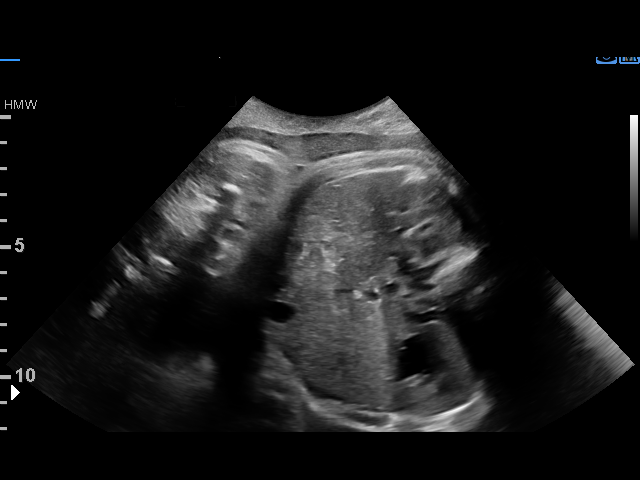
[im 10/14]
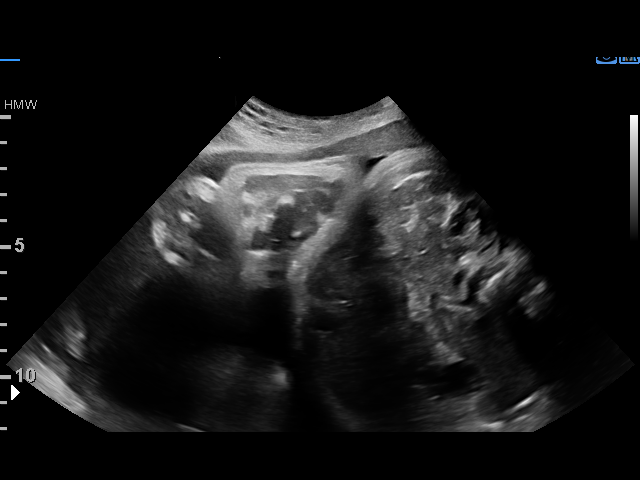
[im 11/14]
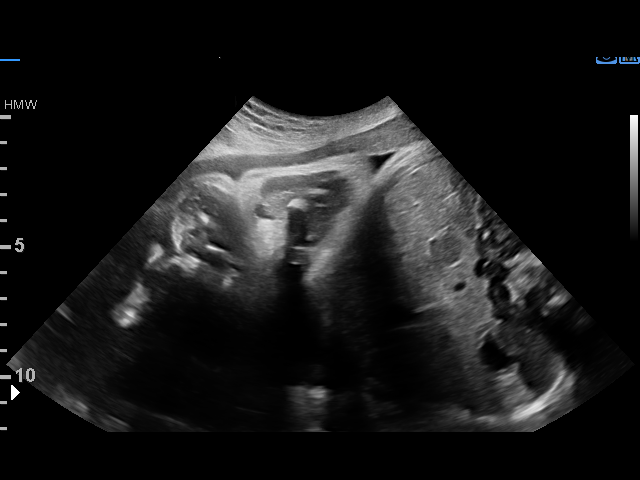
[im 12/14]
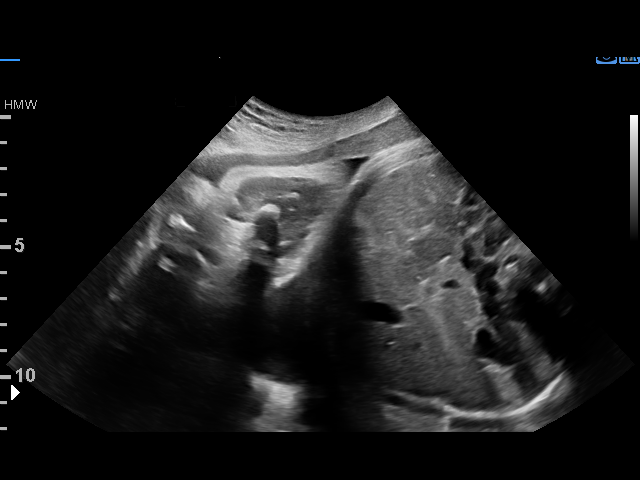
[im 13/14]
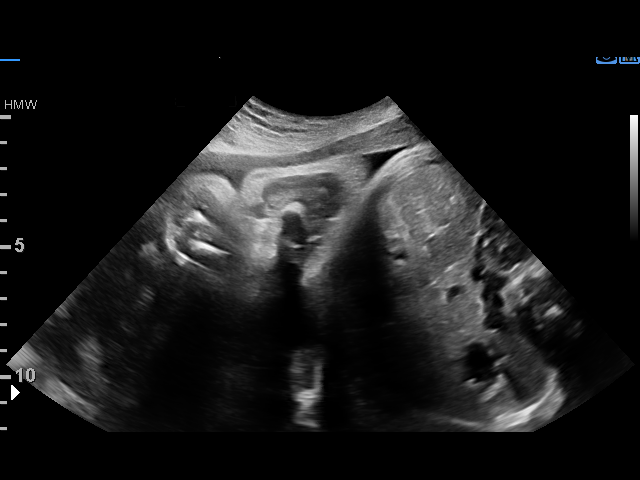
[im 14/14]
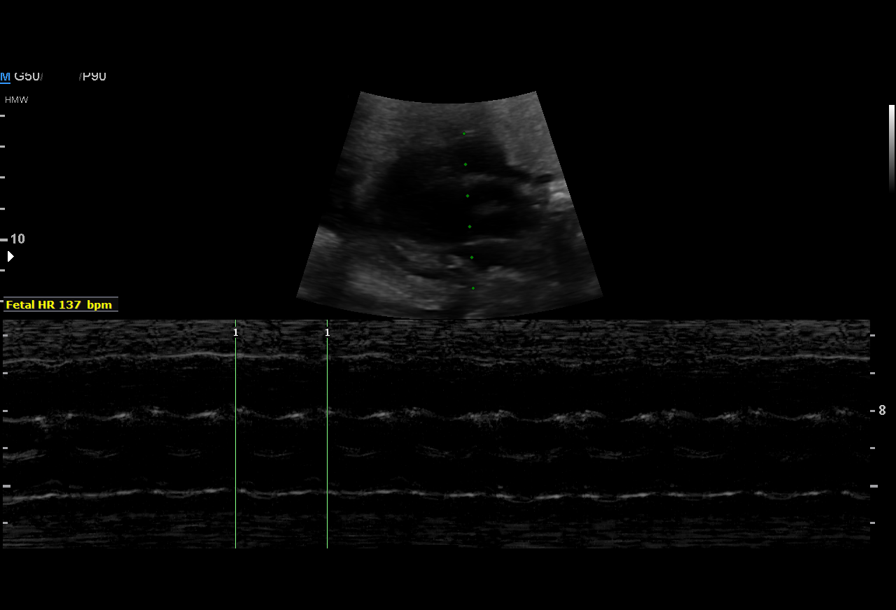

[13 of 14 positions shown; findings below may reference images not displayed]

1  [facility]            [id number]      [id number]     [id number]
Indications

40 weeks gestation of pregnancy
Non-reactive NST
Postdate pregnancy (40-42 weeks)
Insufficient Prenatal Care
OB History

Gravidity:    4         Term:   1         SAB:   1
TOP:          1        Living:  1
Fetal Evaluation

Num Of Fetuses:     1
Fetal Heart         137
Rate(bpm):
Cardiac Activity:   Observed
Presentation:       Cephalic

Amniotic Fluid
AFI FV:      Subjectively within normal limits

AFI Sum(cm)     %Tile       Largest Pocket(cm)
18.2            85

RUQ(cm)       RLQ(cm)       LUQ(cm)        LLQ(cm)
4.61
Biophysical Evaluation

Amniotic F.V:   Within normal limits       F. Tone:        Observed
F. Movement:    Observed                   Score:          [DATE]
F. Breathing:   Observed
Gestational Age

Clinical EDD:  40w 5d                                        EDD:   11/22/16
Best:          40w 5d     Det. By:  Clinical EDD             EDD:   11/22/16
Impression

Singleton intrauterine pregnancy at 40 weeks 5 days
gestation with fetal cardiac activity
Cephalic presentation
BPP [DATE] with an AFI >18 cm
Recommendations

Follow-up ultrasounds as clinically indicated.

## 2019-08-03 ENCOUNTER — Ambulatory Visit: Payer: Self-pay

## 2021-01-08 DIAGNOSIS — E049 Nontoxic goiter, unspecified: Secondary | ICD-10-CM | POA: Diagnosis not present

## 2021-01-08 DIAGNOSIS — N946 Dysmenorrhea, unspecified: Secondary | ICD-10-CM | POA: Diagnosis not present

## 2021-01-08 DIAGNOSIS — Z01419 Encounter for gynecological examination (general) (routine) without abnormal findings: Secondary | ICD-10-CM | POA: Diagnosis not present

## 2021-01-08 DIAGNOSIS — Z124 Encounter for screening for malignant neoplasm of cervix: Secondary | ICD-10-CM | POA: Diagnosis not present

## 2021-01-08 DIAGNOSIS — R5383 Other fatigue: Secondary | ICD-10-CM | POA: Diagnosis not present

## 2021-01-08 DIAGNOSIS — M25559 Pain in unspecified hip: Secondary | ICD-10-CM | POA: Diagnosis not present

## 2021-01-08 DIAGNOSIS — L680 Hirsutism: Secondary | ICD-10-CM | POA: Diagnosis not present

## 2021-02-06 ENCOUNTER — Other Ambulatory Visit: Payer: Self-pay | Admitting: Obstetrics and Gynecology

## 2021-02-06 DIAGNOSIS — N946 Dysmenorrhea, unspecified: Secondary | ICD-10-CM

## 2021-02-06 DIAGNOSIS — E049 Nontoxic goiter, unspecified: Secondary | ICD-10-CM

## 2021-02-15 ENCOUNTER — Other Ambulatory Visit: Payer: Self-pay

## 2021-02-20 ENCOUNTER — Ambulatory Visit
Admission: RE | Admit: 2021-02-20 | Discharge: 2021-02-20 | Disposition: A | Discharge status: Home / Self Care | Payer: Medicaid Other | Source: Ambulatory Visit | Attending: Obstetrics and Gynecology | Admitting: Obstetrics and Gynecology

## 2021-02-20 ENCOUNTER — Ambulatory Visit
Admission: RE | Admit: 2021-02-20 | Discharge: 2021-02-20 | Disposition: A | Discharge status: Home / Self Care | Payer: 59 | Source: Ambulatory Visit | Attending: Obstetrics and Gynecology | Admitting: Obstetrics and Gynecology

## 2021-02-20 DIAGNOSIS — E049 Nontoxic goiter, unspecified: Secondary | ICD-10-CM

## 2021-02-20 DIAGNOSIS — N946 Dysmenorrhea, unspecified: Secondary | ICD-10-CM

## 2021-02-20 DIAGNOSIS — E041 Nontoxic single thyroid nodule: Secondary | ICD-10-CM | POA: Diagnosis not present

## 2021-02-20 DIAGNOSIS — N83291 Other ovarian cyst, right side: Secondary | ICD-10-CM | POA: Diagnosis not present

## 2021-04-01 ENCOUNTER — Other Ambulatory Visit: Payer: Self-pay

## 2021-04-01 ENCOUNTER — Emergency Department (HOSPITAL_COMMUNITY)
Admission: EM | Admit: 2021-04-01 | Discharge: 2021-04-01 | Disposition: A | Discharge status: Home / Self Care | Payer: 59 | Attending: Emergency Medicine | Admitting: Emergency Medicine

## 2021-04-01 DIAGNOSIS — T25121A Burn of first degree of right foot, initial encounter: Secondary | ICD-10-CM | POA: Diagnosis not present

## 2021-04-01 DIAGNOSIS — T24212A Burn of second degree of left thigh, initial encounter: Secondary | ICD-10-CM | POA: Insufficient documentation

## 2021-04-01 DIAGNOSIS — Z91040 Latex allergy status: Secondary | ICD-10-CM | POA: Diagnosis not present

## 2021-04-01 DIAGNOSIS — R110 Nausea: Secondary | ICD-10-CM | POA: Diagnosis not present

## 2021-04-01 DIAGNOSIS — T300 Burn of unspecified body region, unspecified degree: Secondary | ICD-10-CM | POA: Diagnosis not present

## 2021-04-01 DIAGNOSIS — Z87891 Personal history of nicotine dependence: Secondary | ICD-10-CM | POA: Insufficient documentation

## 2021-04-01 DIAGNOSIS — T24211A Burn of second degree of right thigh, initial encounter: Secondary | ICD-10-CM | POA: Diagnosis not present

## 2021-04-01 DIAGNOSIS — X110XXA Contact with hot water in bath or tub, initial encounter: Secondary | ICD-10-CM | POA: Diagnosis not present

## 2021-04-01 DIAGNOSIS — T310 Burns involving less than 10% of body surface: Secondary | ICD-10-CM | POA: Diagnosis not present

## 2021-04-01 DIAGNOSIS — T24011A Burn of unspecified degree of right thigh, initial encounter: Secondary | ICD-10-CM | POA: Diagnosis present

## 2021-04-01 MED ORDER — FENTANYL CITRATE (PF) 100 MCG/2ML IJ SOLN
50.0000 ug | Freq: Once | INTRAMUSCULAR | Status: AC
  Administered 2021-04-01: 22:00:00 50 ug via INTRAVENOUS
  Filled 2021-04-01: qty 2

## 2021-04-01 MED ORDER — ONDANSETRON HCL 4 MG/2ML IJ SOLN
4.0000 mg | Freq: Once | INTRAMUSCULAR | Status: AC
  Administered 2021-04-01: 22:00:00 4 mg via INTRAVENOUS
  Filled 2021-04-01: qty 2

## 2021-04-01 MED ORDER — HYDROCODONE-ACETAMINOPHEN 5-325 MG PO TABS
1.0000 | ORAL_TABLET | Freq: Once | ORAL | Status: AC
  Administered 2021-04-01: 1 via ORAL
  Filled 2021-04-01: qty 1

## 2021-04-01 MED ORDER — HYDROCODONE-ACETAMINOPHEN 5-325 MG PO TABS: 1.0000 | ORAL_TABLET | Freq: Four times a day (QID) | ORAL | 0 refills | Status: DC | PRN

## 2021-04-01 MED ORDER — BACITRACIN ZINC 500 UNIT/GM EX OINT
TOPICAL_OINTMENT | Freq: Once | CUTANEOUS | Status: AC
  Administered 2021-04-01: 1 via TOPICAL
  Filled 2021-04-01: qty 1.8
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
## 2021-04-01 NOTE — ED Notes (Signed)
Dressing applied to bilateral legs. Care instructions provided to patient.
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
## 2021-04-01 NOTE — ED Notes (Signed)
Pt reporting 10/10 pain in bilateral lower extremities. Discoloration and blistering noted on bilateral mid thighs, back of right knee, and top of right foot. Pt unable to take NSAIDS and does not want narcotics.

## 2021-04-06 DIAGNOSIS — R110 Nausea: Secondary | ICD-10-CM | POA: Diagnosis not present

## 2021-04-06 DIAGNOSIS — T148XXA Other injury of unspecified body region, initial encounter: Secondary | ICD-10-CM | POA: Diagnosis not present

## 2021-04-12 ENCOUNTER — Ambulatory Visit: Payer: 59 | Admitting: Physician Assistant

## 2021-04-19 DIAGNOSIS — T148XXA Other injury of unspecified body region, initial encounter: Secondary | ICD-10-CM | POA: Diagnosis not present

## 2021-04-27 ENCOUNTER — Institutional Professional Consult (permissible substitution): Payer: 59 | Admitting: Plastic Surgery

## 2021-06-26 IMAGING — US US THYROID
1 series · 13 of 25 positions shown · non-contrast
Comparison: None.

CLINICAL DATA: Thyromegaly on exam

EXAM:
THYROID ULTRASOUND
TECHNIQUE: Ultrasound examination of the thyroid gland and adjacent soft
tissues was performed.

[Series 1: us thyroid · 0.07mm/px · 13 of 42 slices shown]
[im 1/42]
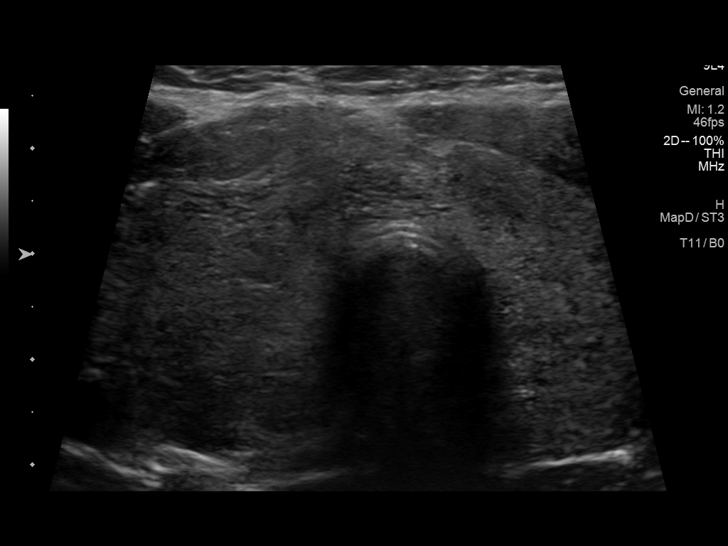
[im 4/42]
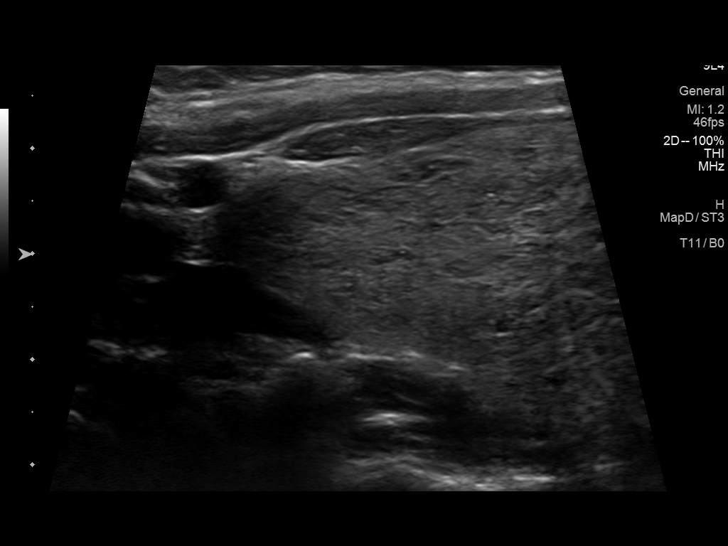
[im 7/42]
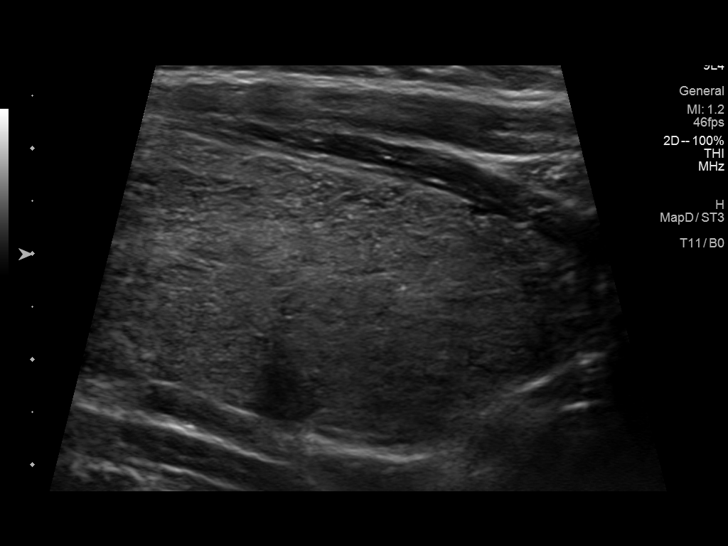
[im 11/42]
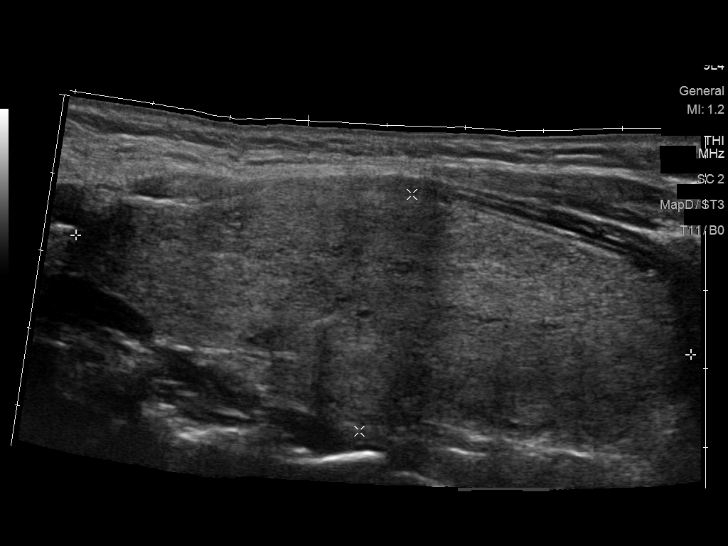
[im 14/42]
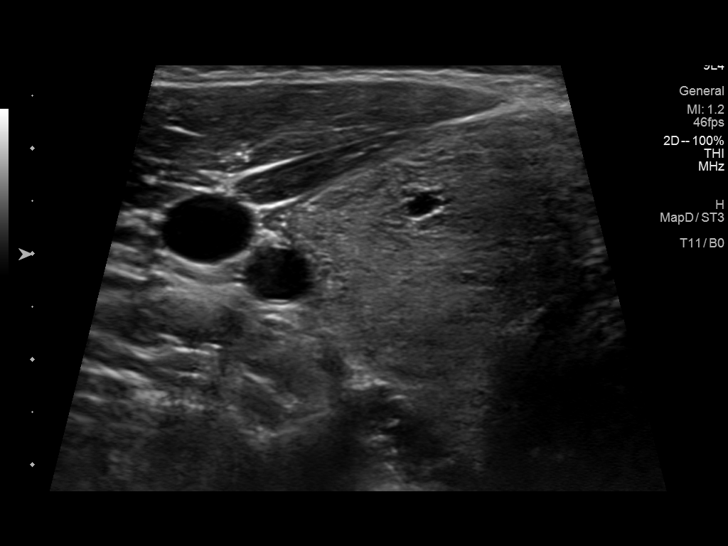
[im 18/42]
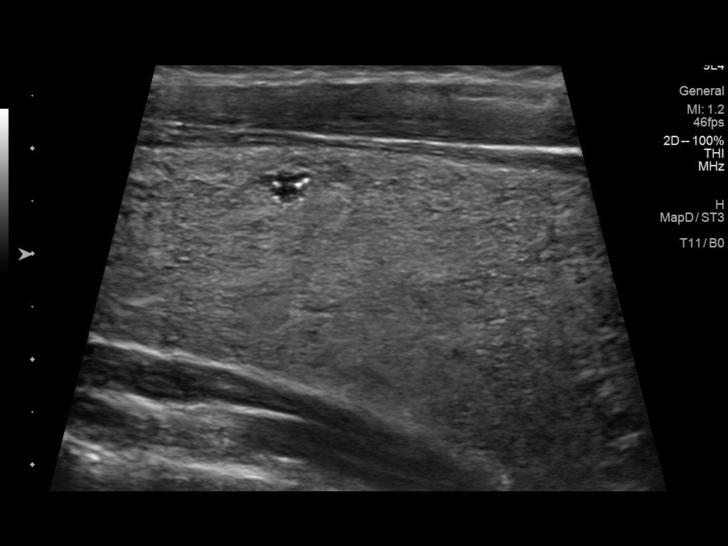
[im 21/42]
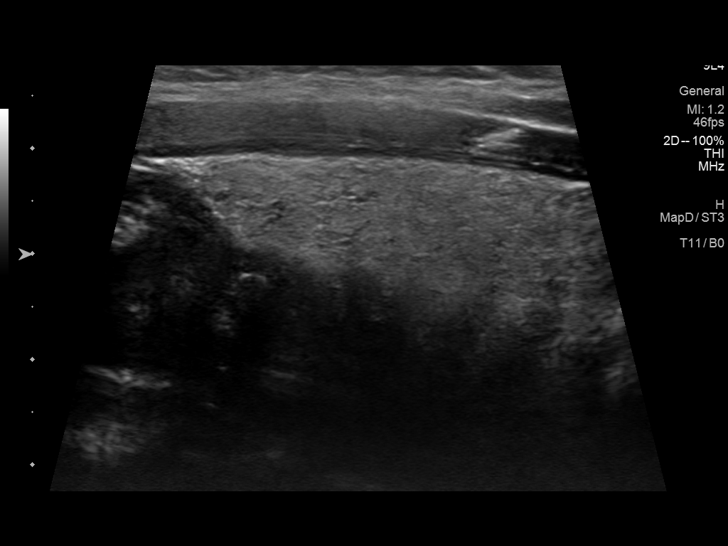
[im 24/42]
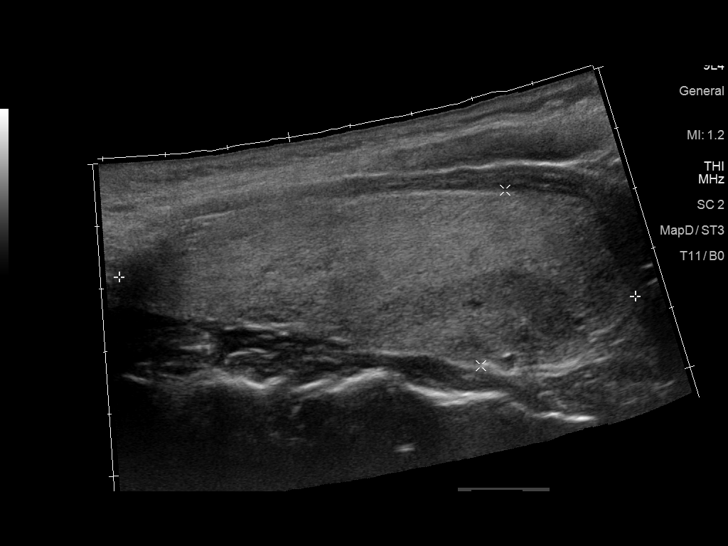
[im 28/42]
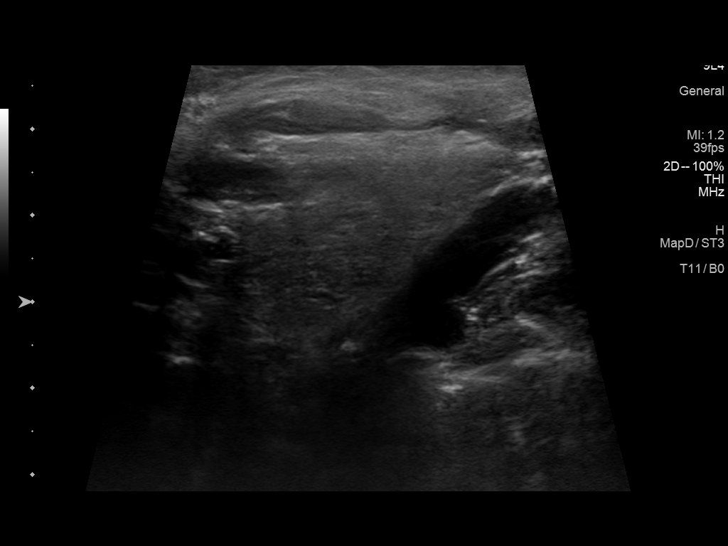
[im 31/42]
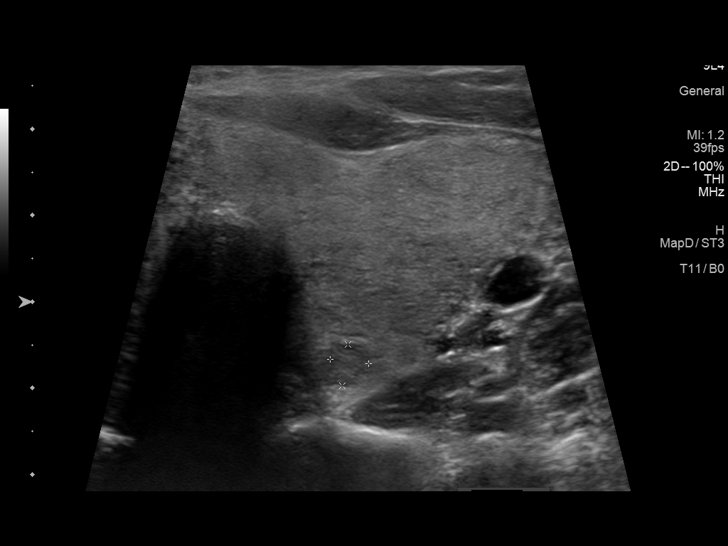
[im 35/42]
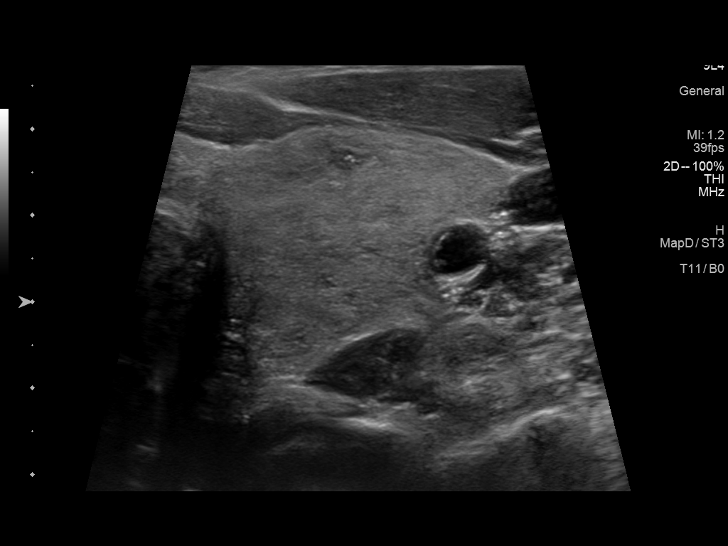
[im 38/42]
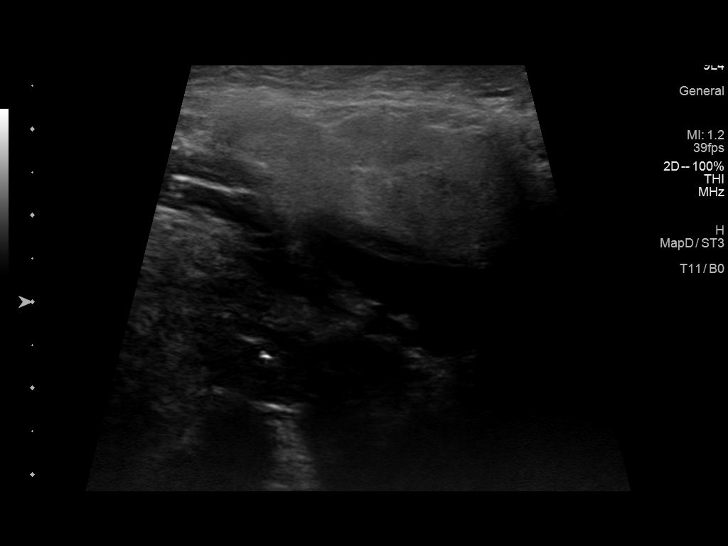
[im 42/42]
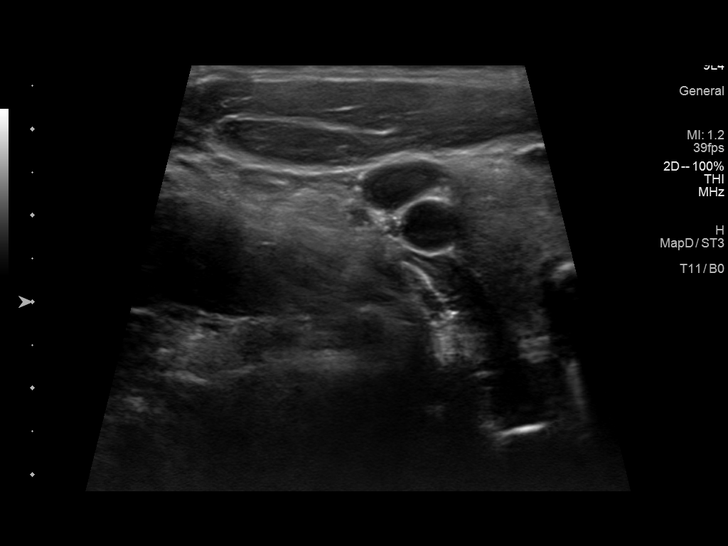

[13 of 25 positions shown; findings below may reference images not displayed]

FINDINGS: Parenchymal Echotexture: Moderately heterogenous

Isthmus: 1 cm

Right lobe: 8.0 x 3.1 x 3.4 cm

Left lobe: 8.3 x 2.8 x 3.7 cm

_________________________________________________________

Estimated total number of nodules >/= 1 cm: 0

Number of spongiform nodules >/=  2 cm not described below (TR1): 0

Number of mixed cystic and solid nodules >/= 1.5 cm not described
below (TR2): 0

_________________________________________________________

Moderate thyroid heterogeneity and diffuse enlargement. Findings
compatible with medical thyroid disease. No hypervascularity. There
are a few subcentimeter scattered hypoechoic and cystic nodules
noted, all measuring 8 mm or less in size. These would not meet
criteria for any biopsy or follow-up and are not fully described by
TI rads criteria.

No regional adenopathy.
IMPRESSION: Nonspecific thyroid enlargement and heterogeneity compatible with
medical thyroid disease. Incidental subcentimeter nodules noted.

The above is in keeping with the ACR TI-RADS recommendations - [HOSPITAL] 7852;[DATE].

## 2022-05-31 ENCOUNTER — Other Ambulatory Visit: Payer: Self-pay

## 2022-05-31 ENCOUNTER — Encounter (HOSPITAL_BASED_OUTPATIENT_CLINIC_OR_DEPARTMENT_OTHER): Payer: Self-pay

## 2022-05-31 DIAGNOSIS — R1011 Right upper quadrant pain: Secondary | ICD-10-CM | POA: Diagnosis not present

## 2022-05-31 DIAGNOSIS — Z91040 Latex allergy status: Secondary | ICD-10-CM | POA: Diagnosis not present

## 2022-05-31 DIAGNOSIS — R109 Unspecified abdominal pain: Secondary | ICD-10-CM | POA: Diagnosis present

## 2022-05-31 LAB — COMPREHENSIVE METABOLIC PANEL
ALT: 14 U/L (ref 0–44)
AST: 19 U/L (ref 15–41)
Albumin: 4 g/dL (ref 3.5–5.0)
Alkaline Phosphatase: 68 U/L (ref 38–126)
Anion gap: 9 (ref 5–15)
BUN: 11 mg/dL (ref 6–20)
CO2: 22 mmol/L (ref 22–32)
Calcium: 8.7 mg/dL — ABNORMAL LOW (ref 8.9–10.3)
Chloride: 107 mmol/L (ref 98–111)
Creatinine, Ser: 0.99 mg/dL (ref 0.44–1.00)
GFR, Estimated: >60 mL/min (ref >60)
Glucose, Bld: 95 mg/dL (ref 70–99)
Potassium: 4.1 mmol/L (ref 3.5–5.1)
Sodium: 138 mmol/L (ref 135–145)
Total Bilirubin: 0.2 mg/dL — ABNORMAL LOW (ref 0.3–1.2)
Total Protein: 7 g/dL (ref 6.5–8.1)

## 2022-05-31 LAB — TROPONIN I (HIGH SENSITIVITY): Troponin I (High Sensitivity): <2 ng/L (ref <18)

## 2022-05-31 LAB — CBC
HCT: 37.3 % (ref 36.0–46.0)
Hemoglobin: 12.6 g/dL (ref 12.0–15.0)
MCH: 28.6 pg (ref 26.0–34.0)
MCHC: 33.8 g/dL (ref 30.0–36.0)
MCV: 84.8 fL (ref 80.0–100.0)
Platelets: 386 10*3/uL (ref 150–400)
RBC: 4.4 MIL/uL (ref 3.87–5.11)
RDW: 14.9 % (ref 11.5–15.5)
WBC: 7 10*3/uL (ref 4.0–10.5)
nRBC: 0 % (ref 0.0–0.2)

## 2022-05-31 LAB — LIPASE, BLOOD: Lipase: 23 U/L (ref 11–51)

## 2022-05-31 NOTE — ED Triage Notes (Signed)
Patient here POV from Home.  Endorses RLQ ABD Pain that began today at 1500 and RL Chest Pain that also began today.  No Fevers. Some SOB. Some Nausea. No Emesis. Interchanging Constipation and Diarrhea.   Uncomfortable during Triage. A&Ox4. GCS 15. BIB Wheelchair

## 2022-05-31 NOTE — ED Notes (Signed)
Called to waiting room for c/o pain in arm from venipuncture site. Mild swelling in forearm. Cap refill less than 3 s. Strong radial pulse.  Ice applied to site

## 2022-06-01 ENCOUNTER — Emergency Department (HOSPITAL_BASED_OUTPATIENT_CLINIC_OR_DEPARTMENT_OTHER)
Admission: EM | Admit: 2022-06-01 | Discharge: 2022-06-01 | Disposition: A | Discharge status: Home / Self Care | Payer: No Typology Code available for payment source | Attending: Emergency Medicine | Admitting: Emergency Medicine

## 2022-06-01 ENCOUNTER — Emergency Department (HOSPITAL_BASED_OUTPATIENT_CLINIC_OR_DEPARTMENT_OTHER): Payer: No Typology Code available for payment source

## 2022-06-01 DIAGNOSIS — R1011 Right upper quadrant pain: Secondary | ICD-10-CM

## 2022-06-01 LAB — URINALYSIS, ROUTINE W REFLEX MICROSCOPIC
Bilirubin Urine: NEGATIVE
Glucose, UA: NEGATIVE mg/dL
Hgb urine dipstick: NEGATIVE
Ketones, ur: NEGATIVE mg/dL
Leukocytes,Ua: NEGATIVE
Nitrite: NEGATIVE
Specific Gravity, Urine: 1.03 (ref 1.005–1.030)
pH: 5.5 (ref 5.0–8.0)

## 2022-06-01 LAB — TROPONIN I (HIGH SENSITIVITY): Troponin I (High Sensitivity): <2 ng/L (ref <18)

## 2022-06-01 LAB — PREGNANCY, URINE: Preg Test, Ur: NEGATIVE

## 2022-06-01 MED ORDER — DICYCLOMINE HCL 20 MG PO TABS: 20.0000 mg | ORAL_TABLET | Freq: Two times a day (BID) | ORAL | 0 refills | Status: DC

## 2022-06-01 MED ORDER — FENTANYL CITRATE PF 50 MCG/ML IJ SOSY
25.0000 ug | PREFILLED_SYRINGE | Freq: Once | INTRAMUSCULAR | Status: AC
  Administered 2022-06-01: 25 ug via INTRAVENOUS
  Filled 2022-06-01: qty 1

## 2022-06-01 MED ORDER — ONDANSETRON HCL 4 MG/2ML IJ SOLN
4.0000 mg | Freq: Once | INTRAMUSCULAR | Status: AC
  Administered 2022-06-01: 4 mg via INTRAVENOUS
  Filled 2022-06-01: qty 2

## 2022-06-01 MED ORDER — IOHEXOL 300 MG/ML  SOLN: 100.0000 mL | Freq: Once | INTRAMUSCULAR | Status: DC | PRN

## 2022-06-01 MED ORDER — SODIUM CHLORIDE 0.9 % IV BOLUS
1000.0000 mL | Freq: Once | INTRAVENOUS | Status: AC
  Administered 2022-06-01: 1000 mL via INTRAVENOUS

## 2022-06-01 MED ORDER — ONDANSETRON 4 MG PO TBDP: 4.0000 mg | ORAL_TABLET | Freq: Three times a day (TID) | ORAL | 0 refills | Status: DC | PRN

## 2022-06-01 NOTE — ED Provider Notes (Signed)
[facility] EMERGENCY DEPT  Provider Note  CSN: [id number] Arrival date & time: 05/31/22 2027  History Chief Complaint  Patient presents with   Abdominal Pain    Cassandra Mann is a 41 y.o. female reports a month of persistent right sided abdominal pain. Waxes and wanes, worse today. Not worse with eating. Has not seen a doctor for the pain. No vomiting. No diarrhea. No fever.    Home Medications Prior to Admission medications   Medication Sig Start Date End Date Taking? Authorizing Provider  dicyclomine (BENTYL) 20 MG tablet Take 1 tablet (20 mg total) by mouth 2 (two) times daily. 06/01/22  Yes Pollyann Savoy, MD  ondansetron (ZOFRAN-ODT) 4 MG disintegrating tablet Take 1 tablet (4 mg total) by mouth every 8 (eight) hours as needed for nausea or vomiting. 06/01/22  Yes Pollyann Savoy, MD  norethindrone (MICRONOR,CAMILA,ERRIN) 0.35 MG tablet Take 1 tablet (0.35 mg total) by mouth daily. 12/02/16   Allie Bossier, MD     Allergies    Latex, Naproxen, and Tramadol   Review of Systems   Review of Systems Please see HPI for pertinent positives and negatives  Physical Exam BP 110/74 (BP Location: Left Arm)   Pulse (!) 56   Temp (!) 97.4 F (36.3 C) (Oral)   Resp 19   Ht 5\' 5"  (1.651 m)   Wt 108.9 kg   SpO2 98%   BMI 39.95 kg/m   Physical Exam Vitals and nursing note reviewed.  Constitutional:      Appearance: Normal appearance.  HENT:     Head: Normocephalic and atraumatic.     Nose: Nose normal.     Mouth/Throat:     Mouth: Mucous membranes are moist.  Eyes:     Extraocular Movements: Extraocular movements intact.     Conjunctiva/sclera: Conjunctivae normal.  Cardiovascular:     Rate and Rhythm: Normal rate.  Pulmonary:     Effort: Pulmonary effort is normal.     Breath sounds: Normal breath sounds.  Abdominal:     General: Abdomen is flat.     Palpations: Abdomen is soft.     Tenderness: There is abdominal tenderness in the right upper  quadrant. There is no guarding. Positive signs include Murphy's sign. Negative signs include McBurney's sign.  Musculoskeletal:        General: No swelling. Normal range of motion.     Cervical back: Neck supple.  Skin:    General: Skin is warm and dry.  Neurological:     General: No focal deficit present.     Mental Status: She is alert.  Psychiatric:        Mood and Affect: Mood normal.     ED Results / Procedures / Treatments   EKG EKG Interpretation  Date/Time:  Friday May 31 2022 20:37:34 EDT Ventricular Rate:  82 PR Interval:  148 QRS Duration: 70 QT Interval:  370 QTC Calculation: 432 R Axis:   88 Text Interpretation: Normal sinus rhythm Normal ECG When compared with ECG of 21-Apr-2012 00:40, No significant change was found Confirmed by 23-Apr-2012 [phone]) on 06/01/2022 2:09:30 AM  Procedures Procedures  Medications Ordered in the ED Medications  iohexol (OMNIPAQUE) 300 MG/ML solution 100 mL (has no administration in time range)  fentaNYL (SUBLIMAZE) injection 25 mcg (25 mcg Intravenous Given 06/01/22 0255)  sodium chloride 0.9 % bolus 1,000 mL (0 mLs Intravenous Paused 06/01/22 0258)  ondansetron (ZOFRAN) injection 4 mg (4 mg Intravenous Given 06/01/22  6010)    Initial Impression and Plan  Patient here with persistent RUQ pain. Concern for biliary colic. Labs done in triage show normal CBC, CMP and lipase. Patient was also apparently complaining of R lower chest pains prompting Trop which was also neg. I personally viewed the images from radiology studies and agree with radiologist interpretation: CXR is clear. Will send for CT to eval RUQ as [place] is not available at this hour.    ED Course   Clinical Course as of 06/01/22 0419  Sat Jun 01, 2022  0235 UA and HCG are neg.  [CS]  [phone] I personally viewed the images from radiology studies and agree with radiologist interpretation: CT is neg for acute process. Patient aware of incidental, small renal and ovarian  cysts. Recommend Zofran, Bentyl for symptom control and PCP follow up. RTED for any other concerns.   [CS]    Clinical Course User Index [CS] Pollyann Savoy, MD     MDM Rules/Calculators/A&P Medical Decision Making Problems Addressed: Right upper quadrant abdominal pain: acute illness or injury  Amount and/or Complexity of Data Reviewed Labs: ordered. Decision-making details documented in ED Course. Radiology: ordered and independent interpretation performed. Decision-making details documented in ED Course.  Risk Prescription drug management. Parenteral controlled substances.    Final Clinical Impression(s) / ED Diagnoses Final diagnoses:  Right upper quadrant abdominal pain    Rx / DC Orders ED Discharge Orders          Ordered    ondansetron (ZOFRAN-ODT) 4 MG disintegrating tablet  Every 8 hours PRN        06/01/22 0419    dicyclomine (BENTYL) 20 MG tablet  2 times daily        06/01/22 0419             Pollyann Savoy, MD 06/01/22 [phone]

## 2022-06-01 NOTE — ED Notes (Signed)
Pt reports she is a difficult stick; pt had failed IV earlier in the night and reporting painful RT arm. Pt requesting one time stick due to fear. USIV will be performed.

## 2022-06-01 NOTE — ED Notes (Signed)
Pt was eager to leave; stated her sister had to go and was given her d/c paperwork in the hallway. Ambulatory with even and steady gait.

## 2022-06-01 NOTE — ED Notes (Signed)
Provider at bedside

## 2022-08-18 ENCOUNTER — Emergency Department (HOSPITAL_BASED_OUTPATIENT_CLINIC_OR_DEPARTMENT_OTHER): Payer: No Typology Code available for payment source

## 2022-08-18 ENCOUNTER — Other Ambulatory Visit: Payer: Self-pay

## 2022-08-18 ENCOUNTER — Encounter (HOSPITAL_BASED_OUTPATIENT_CLINIC_OR_DEPARTMENT_OTHER): Payer: Self-pay | Admitting: Radiology

## 2022-08-18 ENCOUNTER — Emergency Department (HOSPITAL_BASED_OUTPATIENT_CLINIC_OR_DEPARTMENT_OTHER)
Admission: EM | Admit: 2022-08-18 | Discharge: 2022-08-18 | Disposition: A | Discharge status: Home / Self Care | Payer: No Typology Code available for payment source | Attending: Emergency Medicine | Admitting: Emergency Medicine

## 2022-08-18 DIAGNOSIS — Z91040 Latex allergy status: Secondary | ICD-10-CM | POA: Insufficient documentation

## 2022-08-18 DIAGNOSIS — Z1152 Encounter for screening for COVID-19: Secondary | ICD-10-CM | POA: Insufficient documentation

## 2022-08-18 DIAGNOSIS — R1031 Right lower quadrant pain: Secondary | ICD-10-CM | POA: Insufficient documentation

## 2022-08-18 DIAGNOSIS — R42 Dizziness and giddiness: Secondary | ICD-10-CM | POA: Diagnosis present

## 2022-08-18 DIAGNOSIS — H538 Other visual disturbances: Secondary | ICD-10-CM | POA: Insufficient documentation

## 2022-08-18 LAB — URINALYSIS, ROUTINE W REFLEX MICROSCOPIC
Bilirubin Urine: NEGATIVE
Glucose, UA: NEGATIVE mg/dL
Hgb urine dipstick: NEGATIVE
Ketones, ur: NEGATIVE mg/dL
Leukocytes,Ua: NEGATIVE
Nitrite: NEGATIVE
Protein, ur: NEGATIVE mg/dL
Specific Gravity, Urine: 1.009 (ref 1.005–1.030)
pH: 7.5 (ref 5.0–8.0)

## 2022-08-18 LAB — COMPREHENSIVE METABOLIC PANEL
ALT: 16 U/L (ref 0–44)
AST: 18 U/L (ref 15–41)
Albumin: 4.4 g/dL (ref 3.5–5.0)
Alkaline Phosphatase: 75 U/L (ref 38–126)
Anion gap: 6 (ref 5–15)
BUN: 9 mg/dL (ref 6–20)
CO2: 27 mmol/L (ref 22–32)
Calcium: 9.5 mg/dL (ref 8.9–10.3)
Chloride: 103 mmol/L (ref 98–111)
Creatinine, Ser: 0.94 mg/dL (ref 0.44–1.00)
GFR, Estimated: >60 mL/min (ref >60)
Glucose, Bld: 92 mg/dL (ref 70–99)
Potassium: 3.8 mmol/L (ref 3.5–5.1)
Sodium: 136 mmol/L (ref 135–145)
Total Bilirubin: 0.3 mg/dL (ref 0.3–1.2)
Total Protein: 7.6 g/dL (ref 6.5–8.1)

## 2022-08-18 LAB — CBC
HCT: 37.2 % (ref 36.0–46.0)
Hemoglobin: 12 g/dL (ref 12.0–15.0)
MCH: 27.3 pg (ref 26.0–34.0)
MCHC: 32.3 g/dL (ref 30.0–36.0)
MCV: 84.7 fL (ref 80.0–100.0)
Platelets: 433 10*3/uL — ABNORMAL HIGH (ref 150–400)
RBC: 4.39 MIL/uL (ref 3.87–5.11)
RDW: 14.7 % (ref 11.5–15.5)
WBC: 5.6 10*3/uL (ref 4.0–10.5)
nRBC: 0 % (ref 0.0–0.2)

## 2022-08-18 LAB — CBG MONITORING, ED: Glucose-Capillary: 94 mg/dL (ref 70–99)

## 2022-08-18 LAB — PREGNANCY, URINE: Preg Test, Ur: NEGATIVE

## 2022-08-18 LAB — SARS CORONAVIRUS 2 BY RT PCR: SARS Coronavirus 2 by RT PCR: NEGATIVE

## 2022-08-18 LAB — LIPASE, BLOOD: Lipase: 15 U/L (ref 11–51)

## 2022-08-18 LAB — LACTIC ACID, PLASMA: Lactic Acid, Venous: 0.6 mmol/L (ref 0.5–1.9)

## 2022-08-18 MED ORDER — TETRACAINE HCL 0.5 % OP SOLN
1.0000 [drp] | Freq: Once | OPHTHALMIC | Status: AC
  Administered 2022-08-18: 1 [drp] via OPHTHALMIC
  Filled 2022-08-18: qty 4

## 2022-08-18 MED ORDER — DIPHENHYDRAMINE HCL 50 MG/ML IJ SOLN
25.0000 mg | Freq: Once | INTRAMUSCULAR | Status: AC
  Administered 2022-08-18: 25 mg via INTRAVENOUS
  Filled 2022-08-18: qty 1

## 2022-08-18 MED ORDER — METOCLOPRAMIDE HCL 5 MG/ML IJ SOLN
10.0000 mg | Freq: Once | INTRAMUSCULAR | Status: AC
  Administered 2022-08-18: 10 mg via INTRAVENOUS
  Filled 2022-08-18: qty 2

## 2022-08-18 MED ORDER — KETOROLAC TROMETHAMINE 15 MG/ML IJ SOLN
15.0000 mg | Freq: Once | INTRAMUSCULAR | Status: AC
  Administered 2022-08-18: 15 mg via INTRAVENOUS
  Filled 2022-08-18: qty 1

## 2022-08-18 MED ORDER — SODIUM CHLORIDE 0.9 % IV BOLUS
1000.0000 mL | Freq: Once | INTRAVENOUS | Status: AC
  Administered 2022-08-18: 1000 mL via INTRAVENOUS

## 2022-08-18 MED ORDER — IOHEXOL 300 MG/ML  SOLN
100.0000 mL | Freq: Once | INTRAMUSCULAR | Status: AC | PRN
  Administered 2022-08-18: 80 mL via INTRAVENOUS

## 2022-08-18 NOTE — ED Notes (Signed)
Attempted to get patient for timed U/S but patient needs pain meds first so will return in a few minutes

## 2022-08-18 NOTE — Discharge Instructions (Addendum)
Please follow-up with your primary care doctor.  If you develop vision changes, severe nausea, vomiting, abdominal pain please return to the ER.

## 2022-08-18 NOTE — ED Triage Notes (Addendum)
Pt reports that she has been having dizziness and blurred vision since Friday. Pt estimates that this began in the afternoon around 2pm.  No focal weakness and describes this as the room spinning and fine when standing and worse when sitting.  Pt also reports "right ovarian pain". LMP was "a couple of weeks ago"

## 2022-08-18 NOTE — ED Provider Notes (Signed)
[facility] EMERGENCY DEPT Provider Note   CSN: [id number] Arrival date & time: 08/18/22  1612     History  Chief Complaint  Patient presents with   Dizziness   Abdominal Pain    Cassandra Mann is a 41 y.o. female, here for dizziness, blurred vision for the last 3 days.  States that it started around 2 PM on Friday, and has been persistent.  She feels like she cannot look straight.  Feels like there is a film over her eyes.  Worse when sitting, fine with standing.  Not worse with movement of head.  No droop of face, weakness on one side of the body, vision changes.  Denies any headache.  States that she does have light sensitivity however.  Also endorses severe right lower quadrant pain has a history of ovarian cyst.  Last menstrual period couple weeks ago.  No nausea, vomiting. No vaginal discharge, urinary symptoms.      Home Medications Prior to Admission medications   Medication Sig Start Date End Date Taking? Authorizing Provider  dicyclomine (BENTYL) 20 MG tablet Take 1 tablet (20 mg total) by mouth 2 (two) times daily. 06/01/22   Truddie Hidden, MD  norethindrone (MICRONOR,CAMILA,ERRIN) 0.35 MG tablet Take 1 tablet (0.35 mg total) by mouth daily. 12/02/16   Emily Filbert, MD  ondansetron (ZOFRAN-ODT) 4 MG disintegrating tablet Take 1 tablet (4 mg total) by mouth every 8 (eight) hours as needed for nausea or vomiting. 06/01/22   Truddie Hidden, MD      Allergies    Latex, Naproxen, and Tramadol    Review of Systems   Review of Systems  Gastrointestinal:  Positive for abdominal pain. Negative for nausea and vomiting.  Neurological:  Positive for dizziness. Negative for numbness.    Physical Exam Updated Vital Signs BP 103/67   Pulse (!) 53   Temp 98 F (36.7 C) (Oral)   Resp 16   LMP 08/04/2022   SpO2 100%  Physical Exam Vitals and nursing note reviewed.  Constitutional:      General: She is not in acute distress.    Appearance: She is  well-developed.  HENT:     Head: Normocephalic and atraumatic.  Eyes:     Intraocular pressure: Right eye pressure is 18 mmHg. Left eye pressure is 18 mmHg. Measurements were taken using a handheld tonometer.    Conjunctiva/sclera: Conjunctivae normal.  Cardiovascular:     Rate and Rhythm: Normal rate and regular rhythm.     Heart sounds: No murmur heard. Pulmonary:     Effort: Pulmonary effort is normal. No respiratory distress.     Breath sounds: Normal breath sounds.  Abdominal:     General: Abdomen is flat.     Palpations: Abdomen is soft.     Tenderness: There is abdominal tenderness in the right lower quadrant. There is no guarding.  Musculoskeletal:        General: No swelling.     Cervical back: Neck supple.  Skin:    General: Skin is warm and dry.     Capillary Refill: Capillary refill takes less than 2 seconds.  Neurological:     Mental Status: She is alert and oriented to person, place, and time.     Cranial Nerves: Cranial nerves 2-12 are intact.     Motor: Motor function is intact.     Coordination: Coordination is intact.     Comments: Difficulty w/extra-occular movements and approximation.   Psychiatric:  Mood and Affect: Mood normal.     ED Results / Procedures / Treatments   Labs (all labs ordered are listed, but only abnormal results are displayed) Labs Reviewed  CBC - Abnormal; Notable for the following components:      Result Value   Platelets 433 (*)    All other components within normal limits  URINALYSIS, ROUTINE W REFLEX MICROSCOPIC - Abnormal; Notable for the following components:   Color, Urine COLORLESS (*)    All other components within normal limits  SARS CORONAVIRUS 2 BY RT PCR  LIPASE, BLOOD  COMPREHENSIVE METABOLIC PANEL  PREGNANCY, URINE  LACTIC ACID, PLASMA  CBG MONITORING, ED    EKG EKG Interpretation  Date/Time:  Sunday August 18 2022 16:54:14 EST Ventricular Rate:  86 PR Interval:  156 QRS Duration: 68 QT  Interval:  374 QTC Calculation: 447 R Axis:   63 Text Interpretation: Normal sinus rhythm with sinus arrhythmia Normal ECG When compared with ECG of 31-May-2022 20:37, No significant change was found Confirmed by Cindee Lame [phone]) on 08/18/2022 8:13:35 PM  Radiology CT ABDOMEN PELVIS W CONTRAST  Result Date: 08/18/2022 CLINICAL DATA:  Right lower quadrant abdominal pain EXAM: CT ABDOMEN AND PELVIS WITH CONTRAST TECHNIQUE: Multidetector CT imaging of the abdomen and pelvis was performed using the standard protocol following bolus administration of intravenous contrast. RADIATION DOSE REDUCTION: This exam was performed according to the departmental dose-optimization program which includes automated exposure control, adjustment of the mA and/or kV according to patient size and/or use of iterative reconstruction technique. CONTRAST:  20mL OMNIPAQUE IOHEXOL 300 MG/ML  SOLN COMPARISON:  06/01/2022 FINDINGS: Lower chest: Lung bases are clear. Hepatobiliary: Liver is within normal limits. Gallbladder is unremarkable. No intrahepatic or extrahepatic duct dilatation. Pancreas: Within normal limits. Spleen: Within normal limits. Adrenals/Urinary Tract: Adrenal glands are within normal limits. 2.7 cm left upper pole renal cyst (series 2/image 21), measuring simple fluid density, benign (Bosniak I). No follow-up is recommended. Right kidney is within normal limits. No hydronephrosis. Bladder is within normal limits. Stomach/Bowel: Stomach is within normal limits. No evidence of bowel obstruction. Normal appendix (series 2/image 64). No colonic wall thickening or inflammatory changes. Vascular/Lymphatic: No evidence of abdominal aortic aneurysm. No suspicious abdominopelvic lymphadenopathy. Reproductive: Uterus is within normal limits. Bilateral ovaries are within normal limits. Other: Trace pelvic ascites, likely physiologic. Musculoskeletal: Visualized osseous structures are within normal limits. IMPRESSION: No  evidence of bowel obstruction. Normal appendix. No CT findings to account for the patient's right lower quadrant abdominal pain. Electronically Signed   By: Julian Hy M.D.   On: 08/18/2022 20:54   CT Head Wo Contrast  Result Date: 08/18/2022 CLINICAL DATA:  Dizziness, blurred vision EXAM: CT HEAD WITHOUT CONTRAST TECHNIQUE: Contiguous axial images were obtained from the base of the skull through the vertex without intravenous contrast. RADIATION DOSE REDUCTION: This exam was performed according to the departmental dose-optimization program which includes automated exposure control, adjustment of the mA and/or kV according to patient size and/or use of iterative reconstruction technique. COMPARISON:  None Available. FINDINGS: Brain: No evidence of acute infarction, hemorrhage, hydrocephalus, extra-axial collection or mass lesion/mass effect. Vascular: No hyperdense vessel or unexpected calcification. Skull: Normal. Negative for fracture or focal lesion. Sinuses/Orbits: The visualized paranasal sinuses are essentially clear. The mastoid air cells are unopacified. Other: None. IMPRESSION: Normal head CT. Electronically Signed   By: Julian Hy M.D.   On: 08/18/2022 19:55   US PELVIC COMPLETE W TRANSVAGINAL AND TORSION R/O  Result Date: 08/18/2022  CLINICAL DATA:  Chronic right lower quadrant pain x5 months EXAM: TRANSABDOMINAL AND TRANSVAGINAL ULTRASOUND OF PELVIS TECHNIQUE: Both transabdominal and transvaginal ultrasound examinations of the pelvis were performed. Transabdominal technique was performed for global imaging of the pelvis including uterus, ovaries, adnexal regions, and pelvic cul-de-sac. It was necessary to proceed with endovaginal exam following the transabdominal exam to visualize the right adnexa. COMPARISON:  CT abdomen/pelvis dated 06/01/2022. Pelvic ultrasound dated 02/20/2021. FINDINGS: Uterus Measurements: 8.0 x 4.6 x 5.9 cm = volume: 11.3 mL. No fibroids or other mass  visualized. Endometrium Thickness: 7 mm.  No focal abnormality visualized. Right ovary Not discretely visualized.  No adnexal mass is seen. Left ovary Measurements: 3.5 x 1.6 x 2.5 cm = volume: 7.2 mL. Normal appearance/no adnexal mass. Other findings Trace pelvic fluid. IMPRESSION: Right ovary is not discretely visualized.  No adnexal mass is seen. Otherwise unremarkable pelvic ultrasound. Electronically Signed   By: Julian Hy M.D.   On: 08/18/2022 19:54    Procedures Procedures    Medications Ordered in ED Medications  diphenhydrAMINE (BENADRYL) injection 25 mg (25 mg Intravenous Given 08/18/22 1850)  ketorolac (TORADOL) 15 MG/ML injection 15 mg (15 mg Intravenous Given 08/18/22 1850)  sodium chloride 0.9 % bolus 1,000 mL (0 mLs Intravenous Stopped 08/18/22 2034)  metoCLOPramide (REGLAN) injection 10 mg (10 mg Intravenous Given 08/18/22 1849)  iohexol (OMNIPAQUE) 300 MG/ML solution 100 mL (80 mLs Intravenous Contrast Given 08/18/22 2037)  tetracaine (PONTOCAINE) 0.5 % ophthalmic solution 1 drop (1 drop Both Eyes Given by Other 08/18/22 2101)    ED Course/ Medical Decision Making/ A&P                           Medical Decision Making Patient is a 41 year old female, here for blurring of vision, and right lower quadrant pain.  Blurry vision has been going on for the last 3 days, no associated headache, does have photophobia, and some nausea.  Denies any severe eye pain.  States she also feels a bit dizzy.  Etiology unclear, will evaluate R eye, pressures both 18 bilaterally.  Neuro exam fairly within normal limits except for difficulty with extraocular movements.  Will obtain CT head secondary to evaluate from any intracranial aspect.  Additionally will ultrasound to rule out ovarian torsion given severe right lower quadrant pain history of ovarian cyst per patient.  Labs for further evaluation including COVID for possible etiology of dizziness.  Does not have any injected conjunctiva, or  visible ocular abnormalities.  Amount and/or Complexity of Data Reviewed Labs: ordered.    Details: Labs within normal limits including lactate ordered secondary to inconclusive ultrasound of pelvis.  No findings of any tissue death. Radiology: ordered.    Details: CT head unremarkable, no acute findings on CT abdomen pelvis.  Ultrasound could not evaluate right ovary, thus CT was ordered. Discussion of management or test interpretation with external provider(s): Evaluated blurry vision, dizziness from multiple causes.  No neuro findings, resolved with Reglan, Benadryl, Toradol, IV fluids, thought to be likely secondary to complex migraine.  Eye blurring resolved on discharge.  Intraocular pressures were equal at 18 bilaterally.  And improvement in extraocular movements at discharge.  CT abdomen pelvis was unremarkable and lactic acid was unremarkable thus ovarian torsion unlikely, this was ordered secondary to being unable to evaluate right ovary via ultrasound poor visualization.  Abdominal pain had greatly improved at the time of discharge.  Etiology unknown.  Discussed return precautions need  for follow-up with PCP, patient voiced understanding.  Risk Prescription drug management.   Final Clinical Impression(s) / ED Diagnoses Final diagnoses:  Dizziness  Blurred vision  Right lower quadrant abdominal pain    Rx / DC Orders ED Discharge Orders     None         Arshiya Jakes, Si Gaul, PA 08/18/22 2219    Audley Hose, MD 08/19/22 1258

## 2023-08-05 ENCOUNTER — Other Ambulatory Visit: Payer: Self-pay

## 2023-08-05 ENCOUNTER — Inpatient Hospital Stay (EMERGENCY_DEPARTMENT_HOSPITAL): Payer: Commercial Managed Care - PPO | Admitting: Certified Registered Nurse Anesthetist

## 2023-08-05 ENCOUNTER — Inpatient Hospital Stay (HOSPITAL_COMMUNITY): Payer: Commercial Managed Care - PPO

## 2023-08-05 ENCOUNTER — Encounter (HOSPITAL_COMMUNITY): Payer: Self-pay | Admitting: Obstetrics and Gynecology

## 2023-08-05 ENCOUNTER — Encounter (HOSPITAL_COMMUNITY)
Admission: AD | Disposition: A | Discharge status: Home / Self Care | Payer: Self-pay | Source: Home / Self Care | Attending: Obstetrics and Gynecology

## 2023-08-05 ENCOUNTER — Inpatient Hospital Stay (HOSPITAL_COMMUNITY): Payer: Commercial Managed Care - PPO | Admitting: Certified Registered Nurse Anesthetist

## 2023-08-05 ENCOUNTER — Inpatient Hospital Stay (HOSPITAL_COMMUNITY)
Admission: AD | Admit: 2023-08-05 | Discharge: 2023-08-05 | Disposition: A | Discharge status: Home / Self Care | Payer: Commercial Managed Care - PPO | Attending: Obstetrics and Gynecology | Admitting: Obstetrics and Gynecology

## 2023-08-05 DIAGNOSIS — O021 Missed abortion: Secondary | ICD-10-CM | POA: Diagnosis not present

## 2023-08-05 DIAGNOSIS — R58 Hemorrhage, not elsewhere classified: Secondary | ICD-10-CM | POA: Diagnosis not present

## 2023-08-05 DIAGNOSIS — F17210 Nicotine dependence, cigarettes, uncomplicated: Secondary | ICD-10-CM | POA: Insufficient documentation

## 2023-08-05 DIAGNOSIS — Z743 Need for continuous supervision: Secondary | ICD-10-CM | POA: Diagnosis not present

## 2023-08-05 DIAGNOSIS — O074 Failed attempted termination of pregnancy without complication: Secondary | ICD-10-CM | POA: Diagnosis present

## 2023-08-05 DIAGNOSIS — Z332 Encounter for elective termination of pregnancy: Secondary | ICD-10-CM | POA: Diagnosis not present

## 2023-08-05 DIAGNOSIS — Z3A00 Weeks of gestation of pregnancy not specified: Secondary | ICD-10-CM | POA: Diagnosis not present

## 2023-08-05 DIAGNOSIS — R1084 Generalized abdominal pain: Secondary | ICD-10-CM | POA: Diagnosis not present

## 2023-08-05 DIAGNOSIS — O034 Incomplete spontaneous abortion without complication: Secondary | ICD-10-CM | POA: Diagnosis not present

## 2023-08-05 DIAGNOSIS — O26899 Other specified pregnancy related conditions, unspecified trimester: Secondary | ICD-10-CM | POA: Diagnosis not present

## 2023-08-05 DIAGNOSIS — O09519 Supervision of elderly primigravida, unspecified trimester: Secondary | ICD-10-CM | POA: Diagnosis not present

## 2023-08-05 DIAGNOSIS — O039 Complete or unspecified spontaneous abortion without complication: Secondary | ICD-10-CM | POA: Diagnosis not present

## 2023-08-05 HISTORY — PX: DILATION AND EVACUATION: SHX1459

## 2023-08-05 LAB — CBC
HCT: 32.2 % — ABNORMAL LOW (ref 36.0–46.0)
Hemoglobin: 10.7 g/dL — ABNORMAL LOW (ref 12.0–15.0)
MCH: 27.2 pg (ref 26.0–34.0)
MCHC: 33.2 g/dL (ref 30.0–36.0)
MCV: 81.7 fL (ref 80.0–100.0)
Platelets: 334 10*3/uL (ref 150–400)
RBC: 3.94 MIL/uL (ref 3.87–5.11)
RDW: 17.7 % — ABNORMAL HIGH (ref 11.5–15.5)
WBC: 7.7 10*3/uL (ref 4.0–10.5)
nRBC: 0 % (ref 0.0–0.2)

## 2023-08-05 LAB — COMPREHENSIVE METABOLIC PANEL
ALT: 13 U/L (ref 0–44)
AST: 17 U/L (ref 15–41)
Albumin: 3 g/dL — ABNORMAL LOW (ref 3.5–5.0)
Alkaline Phosphatase: 48 U/L (ref 38–126)
Anion gap: 7 (ref 5–15)
BUN: 5 mg/dL — ABNORMAL LOW (ref 6–20)
CO2: 21 mmol/L — ABNORMAL LOW (ref 22–32)
Calcium: 8.7 mg/dL — ABNORMAL LOW (ref 8.9–10.3)
Chloride: 107 mmol/L (ref 98–111)
Creatinine, Ser: 0.63 mg/dL (ref 0.44–1.00)
GFR, Estimated: >60 mL/min (ref >60)
Glucose, Bld: 101 mg/dL — ABNORMAL HIGH (ref 70–99)
Potassium: 3.3 mmol/L — ABNORMAL LOW (ref 3.5–5.1)
Sodium: 135 mmol/L (ref 135–145)
Total Bilirubin: 0.5 mg/dL (ref 0.3–1.2)
Total Protein: 5.7 g/dL — ABNORMAL LOW (ref 6.5–8.1)

## 2023-08-05 LAB — TYPE AND SCREEN
ABO/RH(D): O POS
Antibody Screen: NEGATIVE

## 2023-08-05 SURGERY — DILATION AND EVACUATION, UTERUS
Anesthesia: General

## 2023-08-05 MED ORDER — MIDAZOLAM HCL 2 MG/2ML IJ SOLN
INTRAMUSCULAR | Status: DC | PRN
  Administered 2023-08-05: 2 mg via INTRAVENOUS

## 2023-08-05 MED ORDER — 0.9 % SODIUM CHLORIDE (POUR BTL) OPTIME
TOPICAL | Status: DC | PRN
  Administered 2023-08-05: 1000 mL

## 2023-08-05 MED ORDER — PROPOFOL 10 MG/ML IV BOLUS
INTRAVENOUS | Status: AC
Start: 2023-08-05 — End: ?
  Filled 2023-08-05: qty 20

## 2023-08-05 MED ORDER — GLYCOPYRROLATE PF 0.2 MG/ML IJ SOSY
PREFILLED_SYRINGE | INTRAMUSCULAR | Status: AC
  Filled 2023-08-05: qty 1

## 2023-08-05 MED ORDER — DIPHENHYDRAMINE HCL 50 MG/ML IJ SOLN
INTRAMUSCULAR | Status: DC | PRN
  Administered 2023-08-05: 12.5 mg via INTRAVENOUS

## 2023-08-05 MED ORDER — KETOROLAC TROMETHAMINE 30 MG/ML IJ SOLN
INTRAMUSCULAR | Status: AC
  Filled 2023-08-05: qty 1

## 2023-08-05 MED ORDER — FENTANYL CITRATE (PF) 100 MCG/2ML IJ SOLN: 25.0000 ug | INTRAMUSCULAR | Status: DC | PRN

## 2023-08-05 MED ORDER — DIPHENHYDRAMINE HCL 50 MG/ML IJ SOLN
INTRAMUSCULAR | Status: AC
  Filled 2023-08-05: qty 1

## 2023-08-05 MED ORDER — CEFAZOLIN SODIUM 1 G IJ SOLR
INTRAMUSCULAR | Status: AC
  Filled 2023-08-05: qty 20

## 2023-08-05 MED ORDER — DEXAMETHASONE SODIUM PHOSPHATE 10 MG/ML IJ SOLN
INTRAMUSCULAR | Status: DC | PRN
  Administered 2023-08-05: 10 mg via INTRAVENOUS

## 2023-08-05 MED ORDER — HYDROMORPHONE HCL 1 MG/ML IJ SOLN
1.0000 mg | Freq: Once | INTRAMUSCULAR | Status: AC
  Administered 2023-08-05: 1 mg via INTRAVENOUS
  Filled 2023-08-05: qty 1

## 2023-08-05 MED ORDER — DOXYCYCLINE HYCLATE 100 MG IV SOLR
200.0000 mg | Freq: Once | INTRAVENOUS | Status: AC
  Administered 2023-08-05: 200 mg via INTRAVENOUS
  Filled 2023-08-05: qty 200

## 2023-08-05 MED ORDER — SOD CITRATE-CITRIC ACID 500-334 MG/5ML PO SOLN: 30.0000 mL | ORAL | Status: DC

## 2023-08-05 MED ORDER — SUCCINYLCHOLINE CHLORIDE 200 MG/10ML IV SOSY
PREFILLED_SYRINGE | INTRAVENOUS | Status: AC
  Filled 2023-08-05: qty 10

## 2023-08-05 MED ORDER — CHLOROPROCAINE HCL 1 % IJ SOLN
INTRAMUSCULAR | Status: DC | PRN
  Administered 2023-08-05: 10 mL

## 2023-08-05 MED ORDER — PHENYLEPHRINE 80 MCG/ML (10ML) SYRINGE FOR IV PUSH (FOR BLOOD PRESSURE SUPPORT)
PREFILLED_SYRINGE | INTRAVENOUS | Status: DC | PRN
  Administered 2023-08-05: 160 ug via INTRAVENOUS

## 2023-08-05 MED ORDER — FENTANYL CITRATE (PF) 250 MCG/5ML IJ SOLN
INTRAMUSCULAR | Status: AC
  Filled 2023-08-05: qty 5

## 2023-08-05 MED ORDER — DEXAMETHASONE SODIUM PHOSPHATE 10 MG/ML IJ SOLN
INTRAMUSCULAR | Status: AC
  Filled 2023-08-05: qty 1

## 2023-08-05 MED ORDER — OXYCODONE HCL 5 MG PO TABS: 5.0000 mg | ORAL_TABLET | Freq: Once | ORAL | Status: DC | PRN

## 2023-08-05 MED ORDER — AMISULPRIDE (ANTIEMETIC) 5 MG/2ML IV SOLN
INTRAVENOUS | Status: AC
  Filled 2023-08-05: qty 4

## 2023-08-05 MED ORDER — SUCCINYLCHOLINE CHLORIDE 200 MG/10ML IV SOSY
PREFILLED_SYRINGE | INTRAVENOUS | Status: DC | PRN
  Administered 2023-08-05: 140 mg via INTRAVENOUS

## 2023-08-05 MED ORDER — DEXMEDETOMIDINE HCL IN NACL 80 MCG/20ML IV SOLN
INTRAVENOUS | Status: AC
  Filled 2023-08-05: qty 20

## 2023-08-05 MED ORDER — PHENYLEPHRINE 80 MCG/ML (10ML) SYRINGE FOR IV PUSH (FOR BLOOD PRESSURE SUPPORT)
PREFILLED_SYRINGE | INTRAVENOUS | Status: AC
Start: 2023-08-05 — End: ?
  Filled 2023-08-05: qty 10

## 2023-08-05 MED ORDER — OXYCODONE HCL 5 MG/5ML PO SOLN: 5.0000 mg | Freq: Once | ORAL | Status: DC | PRN

## 2023-08-05 MED ORDER — AMISULPRIDE (ANTIEMETIC) 5 MG/2ML IV SOLN
10.0000 mg | Freq: Once | INTRAVENOUS | Status: AC
  Administered 2023-08-05: 10 mg via INTRAVENOUS

## 2023-08-05 MED ORDER — FENTANYL CITRATE (PF) 250 MCG/5ML IJ SOLN
INTRAMUSCULAR | Status: DC | PRN
  Administered 2023-08-05: 75 ug via INTRAVENOUS

## 2023-08-05 MED ORDER — ONDANSETRON 4 MG PO TBDP
8.0000 mg | ORAL_TABLET | Freq: Once | ORAL | Status: AC
  Administered 2023-08-05: 8 mg via ORAL
  Filled 2023-08-05: qty 2

## 2023-08-05 MED ORDER — ONDANSETRON HCL 4 MG/2ML IJ SOLN: 4.0000 mg | Freq: Four times a day (QID) | INTRAMUSCULAR | Status: DC | PRN

## 2023-08-05 MED ORDER — MIDAZOLAM HCL 2 MG/2ML IJ SOLN
INTRAMUSCULAR | Status: AC
  Filled 2023-08-05: qty 2

## 2023-08-05 MED ORDER — PROPOFOL 10 MG/ML IV BOLUS
INTRAVENOUS | Status: DC | PRN
  Administered 2023-08-05: 200 mg via INTRAVENOUS

## 2023-08-05 MED ORDER — LACTATED RINGERS IV SOLN: INTRAVENOUS | Status: DC

## 2023-08-05 MED ORDER — ONDANSETRON HCL 4 MG/2ML IJ SOLN
INTRAMUSCULAR | Status: DC | PRN
  Administered 2023-08-05: 4 mg via INTRAVENOUS

## 2023-08-05 MED ORDER — KETOROLAC TROMETHAMINE 30 MG/ML IJ SOLN
30.0000 mg | Freq: Once | INTRAMUSCULAR | Status: AC
  Administered 2023-08-05: 30 mg via INTRAMUSCULAR
  Filled 2023-08-05: qty 1

## 2023-08-05 MED ORDER — POVIDONE-IODINE 10 % EX SWAB: 2.0000 | Freq: Once | CUTANEOUS | Status: DC

## 2023-08-05 MED ORDER — ONDANSETRON HCL 4 MG/2ML IJ SOLN
INTRAMUSCULAR | Status: AC
  Filled 2023-08-05: qty 2

## 2023-08-05 MED ORDER — LIDOCAINE 2% (20 MG/ML) 5 ML SYRINGE
INTRAMUSCULAR | Status: DC | PRN
  Administered 2023-08-05: 40 mg via INTRAVENOUS

## 2023-08-05 SURGICAL SUPPLY — 20 items
CATH ROBINSON RED A/P 16FR (CATHETERS) ×2 IMPLANT
FILTER UTR ASPR ASSEMBLY (MISCELLANEOUS) ×1 IMPLANT
GLOVE BIOGEL PI IND STRL 7.0 (GLOVE) ×2 IMPLANT
GLOVE ECLIPSE 6.5 STRL STRAW (GLOVE) ×2 IMPLANT
GOWN STRL REUS W/ TWL LRG LVL3 (GOWN DISPOSABLE) ×4 IMPLANT
GOWN STRL REUS W/TWL LRG LVL3 (GOWN DISPOSABLE) ×2
HOSE CONNECTING 18IN BERKELEY (TUBING) ×2 IMPLANT
KIT BERKELEY 1ST TRI 3/8 NO TR (MISCELLANEOUS) ×1 IMPLANT
KIT BERKELEY 1ST TRIMESTER 3/8 (MISCELLANEOUS) ×1 IMPLANT
NS IRRIG 1000ML POUR BTL (IV SOLUTION) ×1 IMPLANT
PACK VAGINAL MINOR WOMEN LF (CUSTOM PROCEDURE TRAY) ×1 IMPLANT
PAD OB MATERNITY 4.3X12.25 (PERSONAL CARE ITEMS) ×2 IMPLANT
SET BERKELEY SUCTION TUBING (SUCTIONS) ×1 IMPLANT
SPIKE FLUID TRANSFER (MISCELLANEOUS) ×1 IMPLANT
TOWEL GREEN STERILE FF (TOWEL DISPOSABLE) ×2 IMPLANT
UNDERPAD 30X36 HEAVY ABSORB (UNDERPADS AND DIAPERS) ×1 IMPLANT
VACURETTE 10 RIGID CVD (CANNULA) IMPLANT
VACURETTE 7MM CVD STRL WRAP (CANNULA) IMPLANT
VACURETTE 8 RIGID CVD (CANNULA) IMPLANT
VACURETTE 9 RIGID CVD (CANNULA) IMPLANT

## 2023-08-05 NOTE — Anesthesia Preprocedure Evaluation (Signed)
Anesthesia Evaluation  Patient identified by MRN, date of birth, ID band Patient awake    Reviewed: Allergy & Precautions, H&P , NPO status , Patient's Chart, lab work & pertinent test results  Airway Mallampati: II   Neck ROM: full    Dental   Pulmonary Current Smoker   breath sounds clear to auscultation       Cardiovascular  Rhythm:regular Rate:Normal  PDA repair as child   Neuro/Psych  Headaches    GI/Hepatic   Endo/Other    Renal/GU      Musculoskeletal   Abdominal   Peds  Hematology   Anesthesia Other Findings   Reproductive/Obstetrics Recent medical abortion                             Anesthesia Physical Anesthesia Plan  ASA: 2  Anesthesia Plan: General   Post-op Pain Management:    Induction: Intravenous  PONV Risk Score and Plan: 2 and Ondansetron, Dexamethasone, Midazolam and Treatment may vary due to age or medical condition  Airway Management Planned: Oral ETT  Additional Equipment:   Intra-op Plan:   Post-operative Plan: Extubation in OR  Informed Consent: I have reviewed the patients History and Physical, chart, labs and discussed the procedure including the risks, benefits and alternatives for the proposed anesthesia with the patient or authorized representative who has indicated his/her understanding and acceptance.     Dental advisory given  Plan Discussed with: CRNA, Anesthesiologist and Surgeon  Anesthesia Plan Comments:        Anesthesia Quick Evaluation

## 2023-08-05 NOTE — MAU Provider Note (Signed)
[name] is a 42 y.o. [phone] female who presents to MAU today with complaint of cramping with spotting.  Reports she went to a [facility] and had confirmed pregnancy in the uterus. Was given mifepristone on Saturday and then took Misoprostol on Sunday. Reports she then had heavy bleeding with clots that gradually improved and then today has sudden onset of severe 10/10 abdominal pain. She reports prior 10 of 10 was labor.   ROS: Review of Systems  Constitutional:  Negative for chills and fever.  Eyes:  Negative for blurred vision and double vision.  Respiratory:  Negative for cough and shortness of breath.   Cardiovascular:  Negative for chest pain and orthopnea.  Gastrointestinal:  Positive for abdominal pain. Negative for constipation, diarrhea, nausea and vomiting.  Genitourinary:  Negative for dysuria, flank pain and frequency.  Musculoskeletal:  Negative for myalgias.  Skin:  Negative for rash.  Neurological:  Negative for dizziness, tingling, weakness and headaches.  Endo/Heme/Allergies:  Does not bruise/bleed easily.  Psychiatric/Behavioral:  Negative for depression and suicidal ideas. The patient is not nervous/anxious.      O BP 127/65 (BP Location: Right Arm)   Pulse 81   Resp 20   LMP 05/31/2023   SpO2 99%  Physical Exam Vitals and nursing note reviewed.  Constitutional:      General: She is not in acute distress.    Appearance: She is well-developed.  HENT:     Head: Normocephalic and atraumatic.  Eyes:     General: No scleral icterus.    Conjunctiva/sclera: Conjunctivae normal.  Cardiovascular:     Rate and Rhythm: Normal rate.  Pulmonary:     Effort: Pulmonary effort is normal.  Chest:     Chest wall: No tenderness.  Abdominal:     Palpations: Abdomen is soft.     Tenderness: There is abdominal tenderness (RLQ). There is no guarding or rebound.  Genitourinary:    Vagina: Normal.  Musculoskeletal:        General: Normal range of motion.      Cervical back: Normal range of motion and neck supple.  Skin:    General: Skin is warm and dry.     Findings: No rash.  Neurological:     Mental Status: She is alert and oriented to person, place, and time.     MDM: moderate  A Acute abdominal pain after IAB  P Toradol 30mg  IM given Zofran ODT  Ordered CBC, CMP Dr Cherly Hensen to see patient   Cassandra Flake, MD 08/05/2023 5:10 PM

## 2023-08-05 NOTE — Transfer of Care (Signed)
Immediate Anesthesia Transfer of Care Note  Patient: Cassandra Mann  Procedure(s) Performed: ULTRASOUND SUCTION DILATATION AND EVACUATION  Patient Location: PACU  Anesthesia Type:General  Level of Consciousness: awake and alert   Airway & Oxygen Therapy: Patient Spontanous Breathing and Patient connected to face mask oxygen  Post-op Assessment: Report given to RN and Post -op Vital signs reviewed and stable  Post vital signs: Reviewed and stable  Last Vitals:  Vitals Value Taken Time  BP 114/53 08/05/23 2046  Temp 36.6 C 08/05/23 2037  Pulse 93 08/05/23 2048  Resp 17 08/05/23 2048  SpO2 100 % 08/05/23 2048  Vitals shown include unfiled device data.  Last Pain:  Vitals:   08/05/23 1906  PainSc: 2          Complications: No notable events documented.

## 2023-08-05 NOTE — Brief Op Note (Signed)
08/05/2023  8:25 PM  PATIENT:  Cassandra Mann  42 y.o. female  PRE-OPERATIVE DIAGNOSIS:  retained poc  POST-OPERATIVE DIAGNOSIS:  retained poc  PROCEDURE:  Procedure(s): ULTRASOUND SUCTION DILATATION AND EVACUATION (N/A)  SURGEON:  Surgeons and Role:    * Maxie Better, MD - Primary  PHYSICIAN ASSISTANT:   ASSISTANTS: {ASSISTANTS:31801}   ANESTHESIA:   {procedures; anesthesia:812}  EBL:  {None/Minimal: 21241}   BLOOD ADMINISTERED:{BLOOD GIVEN TYPES AND AMOUNTS:20467}  DRAINS: {Devices; drains:31758}   LOCAL MEDICATIONS USED:  {LOCAL MEDICATIONS:10721995}  SPECIMEN:  {ONC STAGING; AJCC TYPE OF SPECIMEN:115600001}  DISPOSITION OF SPECIMEN:  {SPECIMEN DISPOSITION:204680}  COUNTS:  {OR COUNTS CORRECT/INCORRECT:204690}  TOURNIQUET:  * No tourniquets in log *  DICTATION: .{DICTATION TYPES:[phone]}  PLAN OF CARE: {OPTIME PLAN OF UJWJ:1914782956}  PATIENT DISPOSITION:  {op note disposition:31782}   Delay start of Pharmacological VTE agent (>24hrs) due to surgical blood loss or risk of bleeding: {YES/NO/NOT APPLICABLE:20182}

## 2023-08-05 NOTE — MAU Note (Signed)
Cassandra Mann is a 42 y.o. at Unknown here in MAU via EMS reporting: severe cramping that began 2-3 hours ago. States took tablets to abort the pregnancy, took 2 on Saturday and 4 on Sunday.  Reports had heavy VB on Sunday for 3 hours than VB tapered off.  Reports just spotting. LMP: 05/31/2023 Onset of complaint: today Pain score: 10 Vitals:   08/05/23 1652  BP: 127/65  Pulse: 81  Resp: 20  SpO2: 100%     FHT:NA Lab orders placed from triage:   None

## 2023-08-05 NOTE — Discharge Instructions (Signed)
CALL  IF TEMP>100.4, NOTHING PER VAGINA X 2 WK, CALL IF SOAKING A MAXI  PAD EVERY HOUR OR MORE FREQUENTLY 

## 2023-08-05 NOTE — Op Note (Unsigned)
NAME: Cassandra Mann, Cassandra Mann MEDICAL RECORD NO: [id number] ACCOUNT NO: [id number] DATE OF BIRTH: Dec 06, 1980 FACILITY: [facility] LOCATION: [facility] PHYSICIAN: Jcion Buddenhagen A. Cherly Hensen, MD  Operative Report   DATE OF PROCEDURE: 08/05/2023  PREOPERATIVE DIAGNOSIS:  Incomplete abortion.  PROCEDURE:  Ultrasound-guided suction, dilation and evacuation.  POSTOPERATIVE DIAGNOSIS:  Incomplete abortion.  ANESTHESIA:  General, paracervical block.  SURGEON:  Maxie Better, MD.  ASSISTANT:  None.  DESCRIPTION OF PROCEDURE:  Under adequate general anesthesia, the patient was placed in the dorsal lithotomy position.  She was sterilely prepped and draped in the usual fashion.  Bladder was not catheterized in anticipation of the ultrasound usage.   Bivalve speculum was placed in vagina.  A single-tooth tenaculum was placed on the anterior lip of the cervix.  10 mL of 1% Nesacaine was injected paracervically at the 3 and 9 o'clock position.  The cervix was easily accepted the #[facility] dilator.  A  #8 curved suction cannula was introduced under ultrasound guidance and a retroflexed uterus with a posterior intramural fibroid. The endometrial cavity was thickened consistent with retained products. Under ultrasound guidance, the cavity was suctioned and  ultimately curetted until all tissue was felt to have been removed at which time, all instruments were then removed from the vagina.  SPECIMEN:  Labeled products of conception was sent to pathology.  ESTIMATED BLOOD LOSS:  10 mL.  COMPLICATIONS:  None.  The patient tolerated the procedure well and was transferred to recovery room in stable condition.   NIK D: 08/05/2023 8:23:35 pm T: 08/05/2023 9:35:00 pm  JOB: [id number]/ [id number]

## 2023-08-05 NOTE — Progress Notes (Signed)
Report called to [facility] in Short Stay

## 2023-08-05 NOTE — Progress Notes (Signed)
Pt transported to OR via stretcher in stable condition. 

## 2023-08-05 NOTE — H&P (Addendum)
History     Cc: abdominal pain  HPI: 42 yo G5P2022 BF LMP 8/17 presents to MAU by EMS due to severe abdominal pain that started today. Pt had medical abortion using pills starting Saturday. Pt noted vaginal bleeding and felt she passed tissue.  Pain started at 3 am and tried to bear it out but became increased. (+) nausea    OB History     Gravida  5   Para  2   Term  2   Preterm      AB  2   Living  2      SAB  1   IAB  1   Ectopic      Multiple  0   Live Births  2           Past Medical History:  Diagnosis Date   Enlarged thyroid    Family history of PDA (patent ductus arteriosus)    repaired at 42 years of age   Former smoker    Quit- 10/30/14   Gestational diabetes    diet controlled   Gestational diabetes mellitus, antepartum    Headache    Heart murmur    had surgery at age 30   Infection    UTI   Postpartum care following vaginal delivery (6/27) 04/10/2015    Past Surgical History:  Procedure Laterality Date   CARDIAC SURGERY     When she was 22 or 42 years old murmur   DILATATION & CURRETTAGE/HYSTEROSCOPY WITH RESECTOCOPE N/A 08/31/2015   Procedure: DILATATION & CURETTAGE , DIAGNOSTIC HYSTEROSCOPY;  Surgeon: Maxie Better, MD;  Location: [facility];  Service: Gynecology;  Laterality: N/A;   LAPAROSCOPY N/A 08/31/2015   Procedure: DIAGNOSTIC LAPAROSCOPY ; with fulgeration  of endometriosis  stage 1  peritoneal  biopsy;  Surgeon: Maxie Better, MD;  Location: [facility];  Service: Gynecology;  Laterality: N/A;    Family History  Problem Relation Age of Onset   Hypertension Mother    Diabetes Father    Hypertension Father    Hyperlipidemia Father    Alcohol abuse Maternal Grandmother        scirrosis   Hypertension Maternal Grandmother    Diabetes Maternal Grandmother    Hyperlipidemia Maternal Grandmother    Heart disease Maternal Grandmother    Diabetes Maternal Grandfather    Cancer Paternal Grandmother        ovarian   Diabetes  Paternal Grandmother    Diabetes Paternal Grandfather    Hearing loss Neg Hx     Social History   Tobacco Use   Smoking status: Some Days    Current packs/day: 0.00    Average packs/day: 0.3 packs/day for 5.0 years (1.3 ttl pk-yrs)    Types: Cigarettes    Start date: 10/30/2009    Last attempt to quit: 10/30/2014    Years since quitting: 8.7   Smokeless tobacco: Never  Vaping Use   Vaping status: Never Used  Substance Use Topics   Alcohol use: No    Alcohol/week: 0.0 standard drinks of alcohol   Drug use: No    Allergies:  Allergies  Allergen Reactions   Latex Itching   Naproxen Palpitations   Tramadol Palpitations    Also gives her migraines     Medications Prior to Admission  Medication Sig Dispense Refill Last Dose   dicyclomine (BENTYL) 20 MG tablet Take 1 tablet (20 mg total) by mouth 2 (two) times daily. 20 tablet 0    norethindrone (  MICRONOR,CAMILA,ERRIN) 0.35 MG tablet Take 1 tablet (0.35 mg total) by mouth daily. 1 Package 11    ondansetron (ZOFRAN-ODT) 4 MG disintegrating tablet Take 1 tablet (4 mg total) by mouth every 8 (eight) hours as needed for nausea or vomiting. 20 tablet 0      Physical Exam   Blood pressure (!) 106/46, pulse 76, resp. rate 20, last menstrual period 05/31/2023, SpO2 100%, unknown if currently breastfeeding.  General appearance: alert, cooperative, and moderate distress Lungs: clear to auscultation bilaterally Heart: regular rate and rhythm, S1, S2 normal, no murmur, click, rub or gallop Abdomen:  soft (+) BS tender RLQ Pelvic:  vulva nl  vagina dark blood in vault, unable to visualize cervix due to ? Mass. Pt unable to tolerate. Bimanual exam cervix dilated with palpable LUS bulge  10 wk size  Adnexa not able to assess Extremities: extremities normal, atraumatic, no cyanosis or edema ED Course  Imp: incomplete AB P) IVF. Sonogram. ABO. IV pain med( dilaudid). Consent prior to pain med  MDM   Serita Kyle, MD 6:21 PM  08/05/2023  Sono completed Findings of retained tissue noted Formal report  in prog Given the results pt advised of need for surgical intervention. Risk of surgery reviewed with pt including infection, bleeding, retained tissue, uterine perforation and its risk possible need for blood transfusion. Pt asked regarding alternative mgmt . Advised given the degree of pain and her requiring narcotic for pain mgmt, this is best options. Pt agree with plan. OR notified. Concern regarding having started a new job

## 2023-08-05 NOTE — Anesthesia Procedure Notes (Signed)
Procedure Name: Intubation Date/Time: 08/05/2023 8:00 PM  Performed by: Tressia Miners, CRNAPre-anesthesia Checklist: Patient identified, Emergency Drugs available, Suction available, Patient being monitored and Timeout performed Patient Re-evaluated:Patient Re-evaluated prior to induction Oxygen Delivery Method: Circle system utilized Preoxygenation: Pre-oxygenation with 100% oxygen Induction Type: IV induction, Rapid sequence and Cricoid Pressure applied Laryngoscope Size: Mac and 4 Grade View: Grade II Tube type: Oral Tube size: 7.0 mm Number of attempts: 1 Airway Equipment and Method: Stylet Placement Confirmation: ETT inserted through vocal cords under direct vision, positive ETCO2 and breath sounds checked- equal and bilateral Secured at: 22 cm Tube secured with: Tape Dental Injury: Teeth and Oropharynx as per pre-operative assessment  Comments: Smooth IV Induction. Eyes taped. RSI performed. DL x 1 with grade 2b view. Atraumatically placed, teeth and lip remain intact as pre-op. Secured with tape. Bilateral breath sounds +/=, EtCO2 +, Adequate TV, VSS.

## 2023-08-06 ENCOUNTER — Encounter (HOSPITAL_COMMUNITY): Payer: Self-pay | Admitting: Obstetrics and Gynecology

## 2023-08-06 ENCOUNTER — Emergency Department (HOSPITAL_BASED_OUTPATIENT_CLINIC_OR_DEPARTMENT_OTHER)
Admission: EM | Admit: 2023-08-06 | Discharge: 2023-08-06 | Disposition: A | Discharge status: Home / Self Care | Payer: Commercial Managed Care - PPO | Attending: Emergency Medicine | Admitting: Emergency Medicine

## 2023-08-06 ENCOUNTER — Emergency Department (HOSPITAL_BASED_OUTPATIENT_CLINIC_OR_DEPARTMENT_OTHER): Payer: Commercial Managed Care - PPO

## 2023-08-06 DIAGNOSIS — Z3A01 Less than 8 weeks gestation of pregnancy: Secondary | ICD-10-CM | POA: Diagnosis not present

## 2023-08-06 DIAGNOSIS — Z91040 Latex allergy status: Secondary | ICD-10-CM | POA: Diagnosis not present

## 2023-08-06 DIAGNOSIS — R110 Nausea: Secondary | ICD-10-CM | POA: Insufficient documentation

## 2023-08-06 DIAGNOSIS — R1031 Right lower quadrant pain: Secondary | ICD-10-CM | POA: Diagnosis not present

## 2023-08-06 DIAGNOSIS — K5730 Diverticulosis of large intestine without perforation or abscess without bleeding: Secondary | ICD-10-CM | POA: Diagnosis not present

## 2023-08-06 DIAGNOSIS — N281 Cyst of kidney, acquired: Secondary | ICD-10-CM | POA: Diagnosis not present

## 2023-08-06 DIAGNOSIS — R1084 Generalized abdominal pain: Secondary | ICD-10-CM | POA: Diagnosis not present

## 2023-08-06 LAB — URINALYSIS, W/ REFLEX TO CULTURE (INFECTION SUSPECTED)
Bacteria, UA: NONE SEEN
Bilirubin Urine: NEGATIVE
Glucose, UA: NEGATIVE mg/dL
Ketones, ur: NEGATIVE mg/dL
Leukocytes,Ua: NEGATIVE
Nitrite: NEGATIVE
RBC / HPF: >50 RBC/hpf (ref 0–5)
Specific Gravity, Urine: >1.046 — ABNORMAL HIGH (ref 1.005–1.030)
pH: 7 (ref 5.0–8.0)

## 2023-08-06 LAB — COMPREHENSIVE METABOLIC PANEL
ALT: 11 U/L (ref 0–44)
AST: 15 U/L (ref 15–41)
Albumin: 3.9 g/dL (ref 3.5–5.0)
Alkaline Phosphatase: 50 U/L (ref 38–126)
Anion gap: 9 (ref 5–15)
BUN: 9 mg/dL (ref 6–20)
CO2: 22 mmol/L (ref 22–32)
Calcium: 9.2 mg/dL (ref 8.9–10.3)
Chloride: 105 mmol/L (ref 98–111)
Creatinine, Ser: 0.82 mg/dL (ref 0.44–1.00)
GFR, Estimated: >60 mL/min (ref >60)
Glucose, Bld: 109 mg/dL — ABNORMAL HIGH (ref 70–99)
Potassium: 3.6 mmol/L (ref 3.5–5.1)
Sodium: 136 mmol/L (ref 135–145)
Total Bilirubin: 0.3 mg/dL (ref 0.3–1.2)
Total Protein: 6.9 g/dL (ref 6.5–8.1)

## 2023-08-06 LAB — CBC WITH DIFFERENTIAL/PLATELET
Abs Immature Granulocytes: 0.13 10*3/uL — ABNORMAL HIGH (ref 0.00–0.07)
Basophils Absolute: 0 10*3/uL (ref 0.0–0.1)
Basophils Relative: 0 %
Eosinophils Absolute: 0 10*3/uL (ref 0.0–0.5)
Eosinophils Relative: 0 %
HCT: 33.5 % — ABNORMAL LOW (ref 36.0–46.0)
Hemoglobin: 11.1 g/dL — ABNORMAL LOW (ref 12.0–15.0)
Immature Granulocytes: 1 %
Lymphocytes Relative: 6 %
Lymphs Abs: 1 10*3/uL (ref 0.7–4.0)
MCH: 27.1 pg (ref 26.0–34.0)
MCHC: 33.1 g/dL (ref 30.0–36.0)
MCV: 81.9 fL (ref 80.0–100.0)
Monocytes Absolute: 0.9 10*3/uL (ref 0.1–1.0)
Monocytes Relative: 5 %
Neutro Abs: 14.2 10*3/uL — ABNORMAL HIGH (ref 1.7–7.7)
Neutrophils Relative %: 88 %
Platelets: 349 10*3/uL (ref 150–400)
RBC: 4.09 MIL/uL (ref 3.87–5.11)
RDW: 18 % — ABNORMAL HIGH (ref 11.5–15.5)
WBC: 16.2 10*3/uL — ABNORMAL HIGH (ref 4.0–10.5)
nRBC: 0 % (ref 0.0–0.2)

## 2023-08-06 MED ORDER — HYDROMORPHONE HCL 1 MG/ML IJ SOLN
1.0000 mg | Freq: Once | INTRAMUSCULAR | Status: AC
  Administered 2023-08-06: 1 mg via INTRAVENOUS
  Filled 2023-08-06: qty 1

## 2023-08-06 MED ORDER — OXYCODONE-ACETAMINOPHEN 5-325 MG PO TABS
2.0000 | ORAL_TABLET | Freq: Once | ORAL | Status: DC
  Filled 2023-08-06: qty 2

## 2023-08-06 MED ORDER — KETOROLAC TROMETHAMINE 30 MG/ML IJ SOLN
30.0000 mg | Freq: Once | INTRAMUSCULAR | Status: AC
  Administered 2023-08-06: 30 mg via INTRAVENOUS
  Filled 2023-08-06: qty 1

## 2023-08-06 MED ORDER — ONDANSETRON HCL 4 MG/2ML IJ SOLN
4.0000 mg | Freq: Once | INTRAMUSCULAR | Status: AC
  Administered 2023-08-06: 4 mg via INTRAVENOUS
  Filled 2023-08-06: qty 2

## 2023-08-06 MED ORDER — OXYCODONE-ACETAMINOPHEN 5-325 MG PO TABS
2.0000 | ORAL_TABLET | Freq: Once | ORAL | Status: AC
  Administered 2023-08-06: 2 via ORAL

## 2023-08-06 MED ORDER — IOHEXOL 300 MG/ML  SOLN
100.0000 mL | Freq: Once | INTRAMUSCULAR | Status: AC | PRN
  Administered 2023-08-06: 100 mL via INTRAVENOUS

## 2023-08-06 MED ORDER — FENTANYL CITRATE PF 50 MCG/ML IJ SOSY: 50.0000 ug | PREFILLED_SYRINGE | Freq: Once | INTRAMUSCULAR | Status: DC

## 2023-08-06 NOTE — ED Triage Notes (Signed)
[facility], pain meds did not help at home. Pain is worse. Pain right abdomen, bleeding vaginally.

## 2023-08-06 NOTE — ED Provider Notes (Signed)
[place] EMERGENCY DEPARTMENT AT [facility] Provider Note   CSN: [id number] Arrival date & time: 08/06/23  1136     History  No chief complaint on file.   Cassandra Mann is a 42 y.o. female.  Patient complains of severe abdominal pain.  Patient reports that she had a D&C yesterday at [facility].  Patient reports that she had medical abortion at [facility] on Sunday.  Patient was seen yesterday at [facility] and diagnosed with retained products requiring her to have a D&E.  Patient reports severe pain today.  Patient called her gynecologist office who advised her to come to the emergency department for evaluation including CT scan.  Patient complains of nausea she denies any vomiting she complains of severe abdominal discomfort and cramping.  Patient denies any diarrhea.  Patient has not had a fever or chills.  The history is provided by the patient. No language interpreter was used.       Home Medications Prior to Admission medications   Not on File      Allergies    Latex, Naproxen, and Tramadol    Review of Systems   Review of Systems  Gastrointestinal:  Positive for abdominal pain.  Genitourinary:  Positive for pelvic pain.  All other systems reviewed and are negative.   Physical Exam Updated Vital Signs BP 116/63 (BP Location: Right Arm)   Pulse 60   Temp 98.7 F (37.1 C) (Oral)   Resp 16   LMP 05/31/2023   SpO2 100%  Physical Exam Vitals and nursing note reviewed.  Constitutional:      Appearance: She is well-developed.  HENT:     Head: Normocephalic.  Cardiovascular:     Rate and Rhythm: Normal rate.  Pulmonary:     Effort: Pulmonary effort is normal.  Abdominal:     General: Abdomen is flat. There is no distension.     Tenderness: There is abdominal tenderness.  Musculoskeletal:        General: Normal range of motion.     Cervical back: Normal range of motion.  Skin:    General: Skin is warm.  Neurological:      General: No focal deficit present.     Mental Status: She is alert and oriented to person, place, and time.  Psychiatric:        Mood and Affect: Mood normal.     ED Results / Procedures / Treatments   Labs (all labs ordered are listed, but only abnormal results are displayed) Labs Reviewed  CBC WITH DIFFERENTIAL/PLATELET - Abnormal; Notable for the following components:      Result Value   WBC 16.2 (*)    Hemoglobin 11.1 (*)    HCT 33.5 (*)    RDW 18.0 (*)    Neutro Abs 14.2 (*)    Abs Immature Granulocytes 0.13 (*)    All other components within normal limits  COMPREHENSIVE METABOLIC PANEL - Abnormal; Notable for the following components:   Glucose, Bld 109 (*)    All other components within normal limits  URINALYSIS, W/ REFLEX TO CULTURE (INFECTION SUSPECTED) - Abnormal; Notable for the following components:   Specific Gravity, Urine >1.046 (*)    Hgb urine dipstick MODERATE (*)    Protein, ur TRACE (*)    All other components within normal limits    EKG None  Radiology US PELVIC COMPLETE W TRANSVAGINAL AND TORSION R/O  Result Date: 08/06/2023 CLINICAL DATA:  Status post surgical abortion with continued  pelvic pain. EXAM: TRANSABDOMINAL AND TRANSVAGINAL ULTRASOUND OF PELVIS DOPPLER ULTRASOUND OF OVARIES TECHNIQUE: Both transabdominal and transvaginal ultrasound examinations of the pelvis were performed. Transabdominal technique was performed for global imaging of the pelvis including uterus, ovaries, adnexal regions, and pelvic cul-de-sac. It was necessary to proceed with endovaginal exam following the transabdominal exam to visualize the anatomy. Color and duplex Doppler ultrasound was utilized to evaluate blood flow to the ovaries. COMPARISON:  None Available. FINDINGS: Uterus Measurements: 13.3 x 5.8 x 7.2 cm = volume: 290.8 mL. No fibroids or other mass visualized. Endometrium Thickness: 4.5 cm.  Endometrium is thickened and hypervascular. Right ovary Measurements: 3.9 x  2.1 x 2.9 cm = volume: 12.6 mL. A corpus luteal cyst is noted measuring 1.4 cm. Left ovary Measurements: 2.6 x 1.8 x 2.6 cm = volume: 6.5 mL. Normal appearance/no adnexal mass. Pulsed Doppler evaluation of both ovaries demonstrates normal low-resistance arterial and venous waveforms. Other findings No abnormal free fluid. IMPRESSION: 1. Thickened hypervascular endometrium, possible retained products of conception. 2. No evidence of ovarian torsion. Electronically Signed   By: Thornell Sartorius M.D.   On: 08/06/2023 20:43   CT ABDOMEN PELVIS W CONTRAST  Result Date: 08/06/2023 CLINICAL DATA:  RLQ abdominal pain Pt is 1 day post d and e. (preg test will still be positive) EXAM: CT ABDOMEN AND PELVIS WITH CONTRAST TECHNIQUE: Multidetector CT imaging of the abdomen and pelvis was performed using the standard protocol following bolus administration of intravenous contrast. RADIATION DOSE REDUCTION: This exam was performed according to the departmental dose-optimization program which includes automated exposure control, adjustment of the mA and/or kV according to patient size and/or use of iterative reconstruction technique. CONTRAST:  OMNIPAQUE IOHEXOL 300 MG/ML  SOLN COMPARISON:  CT AP, 08/18/2022 and 06/01/2022. FINDINGS: Lower chest: No acute abnormality. Hepatobiliary: No focal liver abnormality. No gallstones, gallbladder wall thickening, or biliary dilatation. Pancreas: No pancreatic ductal dilatation or surrounding inflammatory changes. Spleen: Normal in size without focal abnormality. Adrenals/Urinary Tract: Adrenal glands are unremarkable. 3 cm LEFT superior renal pole exophytic simple-appearing renal cyst. No follow-up is recommended. Kidneys are otherwise normal, without renal calculi, or hydronephrosis. Bladder is unremarkable. Stomach/Bowel: Stomach is within normal limits. Appendix appears normal. Mild burden colonic diverticulosis. No evidence of bowel wall thickening, distention, or inflammatory  changes. Vascular/Lymphatic: No significant vascular findings are present. No enlarged abdominal or pelvic lymph nodes. Reproductive: Heterogeneous appearance of uterus. No uterine rupture. Adnexa are unremarkable. Other: No abdominal wall hernia or intraperitoneal free air. No abdominopelvic ascites. Musculoskeletal: No acute or significant osseous findings. IMPRESSION: 1. Heterogeneous appearance of the uterus. No evidence of uterine rupture, focal drainable collection or abscess. 2. 3 cm LEFT Bosniak I benign renal cyst. No follow-up imaging is recommended. JACR 2018 Feb; 264-273, Management of the Incidental Renal Mass on CT, [employer] 2021; 814-848, Bosniak Classification of Cystic Renal Masses, Version 2019. Electronically Signed   By: Roanna Banning M.D.   On: 08/06/2023 15:44   [place] Intraoperative  Result Date: 08/05/2023 CLINICAL DATA:  Ultrasound was provided for use by the ordering physician.  No provider Interpretation or professional fees incurred.    US OB Transvaginal  Result Date: 08/05/2023 CLINICAL DATA:  Cramping after medication abortion EXAM: TRANSVAGINAL OB ULTRASOUND TECHNIQUE: Transvaginal ultrasound was performed for complete evaluation of the gestation as well as the maternal uterus, adnexal regions, and pelvic cul-de-sac. COMPARISON:  None Available. FINDINGS: Intrauterine gestational sac: None Yolk sac:  Not Visualized. Embryo:  Not Visualized. Subchorionic hemorrhage:  None  visualized. Maternal uterus/adnexae: Retroflexed uterus measures 13.6 cm in sagittal dimension. Endometrium is thickened up to 28 mm with an area of internal vascularity of the fundus. Normal ovaries. Small volume pelvic free fluid. IMPRESSION: Sonographic findings suspicious for retained products of conception. Electronically Signed   By: Agustin Cree M.D.   On: 08/05/2023 19:04    Procedures Procedures    Medications Ordered in ED Medications  fentaNYL (SUBLIMAZE) injection 50 mcg (50 mcg Intravenous  Not Given 08/06/23 1733)  oxyCODONE-acetaminophen (PERCOCET/ROXICET) 5-325 MG per tablet 2 tablet (has no administration in time range)  HYDROmorphone (DILAUDID) injection 1 mg (1 mg Intravenous Given 08/06/23 1235)  ondansetron (ZOFRAN) injection 4 mg (4 mg Intravenous Given 08/06/23 1238)  iohexol (OMNIPAQUE) 300 MG/ML solution 100 mL (100 mLs Intravenous Contrast Given 08/06/23 1316)  HYDROmorphone (DILAUDID) injection 1 mg (1 mg Intravenous Given 08/06/23 1400)  HYDROmorphone (DILAUDID) injection 1 mg (1 mg Intravenous Given 08/06/23 1532)  ketorolac (TORADOL) 30 MG/ML injection 30 mg (30 mg Intravenous Given 08/06/23 1648)  ondansetron (ZOFRAN) injection 4 mg (4 mg Intravenous Given 08/06/23 1651)  oxyCODONE-acetaminophen (PERCOCET/ROXICET) 5-325 MG per tablet 2 tablet (2 tablets Oral Given 08/06/23 2121)    ED Course/ Medical Decision Making/ A&P                                 Medical Decision Making Patient complains of abdominal pain.  Patient had a D&E yesterday after a failed medical abortion on Sunday.  Amount and/or Complexity of Data Reviewed External Data Reviewed: notes.    Details: Oncology notes reviewed. Labs: ordered. Decision-making details documented in ED Course.    Details: Labs ordered reviewed and interpreted.  Patient has a slightly elevated white blood cell count of 16.2 Radiology: ordered and independent interpretation performed. Decision-making details documented in ED Course.    Details: CT abdomen and pelvis ordered reviewed and interpreted CT scan shows no acute abnormality. Ultrasound with Dopplers to rule out torsion is obtained.  Radiology reports no evidence of torsion Discussion of management or test interpretation with external provider(s): I discussed the patient with Dr. Cherly Hensen OB/GYN.  She saw the patient yesterday and did a procedure.  She reports that she was able to get products of conception.  She reports no concerns for persistent products of  conception.  Risk Prescription drug management. Risk Details: Patient given IV Dilaudid x 3 dosage she is given Toradol 30 mg IV.  Patient is also given fentanyl 50 mg before ultrasound.  Patient had intermittent relief of pain with pain medications but continued having pain.  Patient observed extended period of time.  Patient did not develop a fever.  Patient has gradually improved.  Patient reports that she is feeling much better and wants to go home on evaluation at 930.  Discussed with the patient that I not sure why she had such significant cramping or pain.  I have advised her to contact Dr. Cherly Hensen tomorrow for recheck.  Patient should return if any problems.           Final Clinical Impression(s) / ED Diagnoses Final diagnoses:  Generalized abdominal pain    Rx / DC Orders ED Discharge Orders     None      An After Visit Summary was printed and given to the patient.    Elson Areas, PA-C 08/06/23 2148    Elayne Snare K, DO 08/06/23 2324

## 2023-08-06 NOTE — ED Notes (Signed)
Patient transported to US 

## 2023-08-06 NOTE — ED Triage Notes (Signed)
Monday was seen at abortion clinic, 8.[redacted] weeks pregnant, given abortion pill. Tuesday horrible pain , led to emergency d and c.

## 2023-08-07 LAB — SURGICAL PATHOLOGY

## 2023-08-07 NOTE — Anesthesia Postprocedure Evaluation (Signed)
Anesthesia Post Note  Patient: Cassandra Mann  Procedure(s) Performed: ULTRASOUND SUCTION DILATATION AND EVACUATION     Patient location during evaluation: PACU Anesthesia Type: General Level of consciousness: awake and alert Pain management: pain level controlled Vital Signs Assessment: post-procedure vital signs reviewed and stable Respiratory status: spontaneous breathing, nonlabored ventilation, respiratory function stable and patient connected to nasal cannula oxygen Cardiovascular status: blood pressure returned to baseline and stable Postop Assessment: no apparent nausea or vomiting Anesthetic complications: no   No notable events documented.  Last Vitals:  Vitals:   08/05/23 2045 08/05/23 2100  BP: (!) 114/53 110/76  Pulse: 98 81  Resp: 15 13  Temp:  36.6 C  SpO2: 100% 100%    Last Pain:  Vitals:   08/05/23 2100  PainSc: 0-No pain                 Aiva Miskell S

## 2023-12-08 DIAGNOSIS — N921 Excessive and frequent menstruation with irregular cycle: Secondary | ICD-10-CM | POA: Diagnosis not present

## 2023-12-08 DIAGNOSIS — Z01419 Encounter for gynecological examination (general) (routine) without abnormal findings: Secondary | ICD-10-CM | POA: Diagnosis not present

## 2023-12-08 DIAGNOSIS — N946 Dysmenorrhea, unspecified: Secondary | ICD-10-CM | POA: Diagnosis not present

## 2024-06-17 DIAGNOSIS — N938 Other specified abnormal uterine and vaginal bleeding: Secondary | ICD-10-CM | POA: Diagnosis not present

## 2024-06-17 DIAGNOSIS — N946 Dysmenorrhea, unspecified: Secondary | ICD-10-CM | POA: Diagnosis not present

## 2024-07-10 ENCOUNTER — Emergency Department (HOSPITAL_COMMUNITY)
Admission: EM | Admit: 2024-07-10 | Discharge: 2024-07-10 | Disposition: A | Discharge status: Home / Self Care | Attending: Emergency Medicine | Admitting: Emergency Medicine

## 2024-07-10 ENCOUNTER — Encounter (HOSPITAL_COMMUNITY): Payer: Self-pay | Admitting: *Deleted

## 2024-07-10 ENCOUNTER — Other Ambulatory Visit: Payer: Self-pay

## 2024-07-10 DIAGNOSIS — R1084 Generalized abdominal pain: Secondary | ICD-10-CM | POA: Diagnosis present

## 2024-07-10 DIAGNOSIS — N946 Dysmenorrhea, unspecified: Secondary | ICD-10-CM | POA: Diagnosis not present

## 2024-07-10 LAB — CBC
HCT: 39.4 % (ref 36.0–46.0)
Hemoglobin: 13 g/dL (ref 12.0–15.0)
MCH: 28.6 pg (ref 26.0–34.0)
MCHC: 33 g/dL (ref 30.0–36.0)
MCV: 86.8 fL (ref 80.0–100.0)
Platelets: 340 K/uL (ref 150–400)
RBC: 4.54 MIL/uL (ref 3.87–5.11)
RDW: 15 % (ref 11.5–15.5)
WBC: 4.6 K/uL (ref 4.0–10.5)
nRBC: 0 % (ref 0.0–0.2)

## 2024-07-10 LAB — LIPASE, BLOOD: Lipase: 24 U/L (ref 11–51)

## 2024-07-10 LAB — COMPREHENSIVE METABOLIC PANEL WITH GFR
ALT: 14 U/L (ref 0–44)
AST: 22 U/L (ref 15–41)
Albumin: 3.6 g/dL (ref 3.5–5.0)
Alkaline Phosphatase: 61 U/L (ref 38–126)
Anion gap: 8 (ref 5–15)
BUN: 8 mg/dL (ref 6–20)
CO2: 24 mmol/L (ref 22–32)
Calcium: 8.9 mg/dL (ref 8.9–10.3)
Chloride: 106 mmol/L (ref 98–111)
Creatinine, Ser: 0.85 mg/dL (ref 0.44–1.00)
GFR, Estimated: >60 mL/min (ref >60)
Glucose, Bld: 103 mg/dL — ABNORMAL HIGH (ref 70–99)
Potassium: 4.1 mmol/L (ref 3.5–5.1)
Sodium: 138 mmol/L (ref 135–145)
Total Bilirubin: 0.4 mg/dL (ref 0.0–1.2)
Total Protein: 5 g/dL — ABNORMAL LOW (ref 6.5–8.1)

## 2024-07-10 LAB — URINALYSIS, MICROSCOPIC (REFLEX)
Bacteria, UA: NONE SEEN
RBC / HPF: >50 RBC/hpf (ref 0–5)

## 2024-07-10 LAB — URINALYSIS, ROUTINE W REFLEX MICROSCOPIC

## 2024-07-10 LAB — HCG, SERUM, QUALITATIVE: Preg, Serum: NEGATIVE

## 2024-07-10 MED ORDER — HYDROMORPHONE HCL 1 MG/ML IJ SOLN
0.5000 mg | Freq: Once | INTRAMUSCULAR | Status: AC
  Administered 2024-07-10: 0.5 mg via INTRAVENOUS
  Filled 2024-07-10: qty 1

## 2024-07-10 MED ORDER — ONDANSETRON HCL 4 MG/2ML IJ SOLN
4.0000 mg | Freq: Once | INTRAMUSCULAR | Status: AC
  Administered 2024-07-10: 4 mg via INTRAVENOUS
  Filled 2024-07-10: qty 2

## 2024-07-10 MED ORDER — KETOROLAC TROMETHAMINE 30 MG/ML IJ SOLN
30.0000 mg | Freq: Once | INTRAMUSCULAR | Status: AC
  Administered 2024-07-10: 30 mg via INTRAVENOUS
  Filled 2024-07-10: qty 1

## 2024-07-10 MED ORDER — HYDROMORPHONE HCL 1 MG/ML IJ SOLN
1.0000 mg | Freq: Once | INTRAMUSCULAR | Status: AC
  Administered 2024-07-10: 1 mg via INTRAVENOUS
  Filled 2024-07-10: qty 1

## 2024-07-10 NOTE — ED Provider Notes (Signed)
 [place] EMERGENCY DEPARTMENT AT [facility] Provider Note   CSN: [id number] Arrival date & time: 07/10/24  8082     Patient presents with: Abdominal Pain   Cassandra Mann is a 43 y.o. female presented to ED with abdominal pain.  Patient reports that she has occasional flareups of chronic abdominal pain which is typically pelvic in nature, associated with abnormal menses.  She has seen OB/GYN for this and will often get Toradol .  However pain is more severe this time.  She reports nausea associated with it.  No vomiting or diarrhea.  No fevers or chills.  Last year had D&C with Dr Rutherford SHIPPER in Oct 2024.  The patient reports that she has been diagnosed with likely endometriosis in the past.  She says she gets this type of pain every time she gets her menses.  She does not tolerate NSAIDs at home.  She has been prescribed Percocet but likes to avoid taking them because they make her feel sick.  She has had 2 CT scans of the abdomen pelvis over the past 2 years for similar abdominal pain with no obvious intra-abdominal source of her symptoms.  She is noted per her OB/GYN notes have a history of dysmenorrhea and excessive and frequent menstruation with irregular cycles.   HPI     Prior to Admission medications   Not on File    Allergies: Latex, Naproxen , and Tramadol     Review of Systems  Updated Vital Signs BP 113/72 (BP Location: Left Arm)   Pulse 94   Temp 97.8 F (36.6 C)   Resp 16   Ht 5' 5 (1.651 m)   Wt 108.9 kg   LMP 07/10/2024   SpO2 100%   BMI 39.95 kg/m   Physical Exam Constitutional:      General: She is not in acute distress. HENT:     Head: Normocephalic and atraumatic.  Eyes:     Conjunctiva/sclera: Conjunctivae normal.     Pupils: Pupils are equal, round, and reactive to light.  Cardiovascular:     Rate and Rhythm: Normal rate and regular rhythm.  Pulmonary:     Effort: Pulmonary effort is normal. No respiratory distress.  Abdominal:      General: There is no distension.     Tenderness: There is abdominal tenderness in the suprapubic area.  Skin:    General: Skin is warm and dry.  Neurological:     General: No focal deficit present.     Mental Status: She is alert. Mental status is at baseline.  Psychiatric:        Mood and Affect: Mood normal.        Behavior: Behavior normal.     (all labs ordered are listed, but only abnormal results are displayed) Labs Reviewed  COMPREHENSIVE METABOLIC PANEL WITH GFR - Abnormal; Notable for the following components:      Result Value   Glucose, Bld 103 (*)    Total Protein 5.0 (*)    All other components within normal limits  URINALYSIS, ROUTINE W REFLEX MICROSCOPIC - Abnormal; Notable for the following components:   Color, Urine RED (*)    APPearance TURBID (*)    Glucose, UA   (*)    Value: TEST NOT REPORTED DUE TO COLOR INTERFERENCE OF URINE PIGMENT   Hgb urine dipstick   (*)    Value: TEST NOT REPORTED DUE TO COLOR INTERFERENCE OF URINE PIGMENT   Bilirubin Urine   (*)  Value: TEST NOT REPORTED DUE TO COLOR INTERFERENCE OF URINE PIGMENT   Ketones, ur   (*)    Value: TEST NOT REPORTED DUE TO COLOR INTERFERENCE OF URINE PIGMENT   Protein, ur   (*)    Value: TEST NOT REPORTED DUE TO COLOR INTERFERENCE OF URINE PIGMENT   Nitrite   (*)    Value: TEST NOT REPORTED DUE TO COLOR INTERFERENCE OF URINE PIGMENT   Leukocytes,Ua   (*)    Value: TEST NOT REPORTED DUE TO COLOR INTERFERENCE OF URINE PIGMENT   All other components within normal limits  LIPASE, BLOOD  CBC  HCG, SERUM, QUALITATIVE  URINALYSIS, MICROSCOPIC (REFLEX)    EKG: None  Radiology: No results found.   Procedures   Medications Ordered in the ED  HYDROmorphone  (DILAUDID ) injection 0.5 mg (has no administration in time range)  HYDROmorphone  (DILAUDID ) injection 1 mg (1 mg Intravenous Given 07/10/24 2222)  ketorolac  (TORADOL ) 30 MG/ML injection 30 mg (30 mg Intravenous Given 07/10/24 2224)   ondansetron  (ZOFRAN ) injection 4 mg (4 mg Intravenous Given 07/10/24 2224)                                    Medical Decision Making Risk Prescription drug management.   This patient presents to the ED with concern for lower abdominal pelvic pain. This involves an extensive number of treatment options, and is a complaint that carries with it a high risk of complications and morbidity.  The differential diagnosis includes dysmenorrhea versus abnormal uterine bleeding versus uterine fibroids versus ovarian cyst versus other  My suspicion is that this is dysmenorrhea with possible endometriosis component.  This happens regularly for her.  I don't think there is a new emergent process causing this to warrant imaging, and I doubt PID.  External records from outside source obtained and reviewed including OB/GYN office records  I ordered and personally interpreted labs.  The pertinent results include: No emergent findings.  Urine is grossly bloody, as patient is support active menses.  Hemoglobin is normal however.  White blood cell count is normal  I ordered medication including IV pain and nausea medication  I have reviewed the patients home medicines and have made adjustments as needed  Test Considered: Low suspicion for PID, ovarian torsion.  No indication for pelvic ultrasound at this time.  Low suspicion for acute appendicitis or intra-abdominal cause of symptoms or pain.  After the interventions noted above, I reevaluated the patient and found that they have: improved  Dispostion:  After consideration of the diagnostic results and the patients response to treatment, I feel that the patent would benefit from close outpatient follow-up      Final diagnoses:  Generalized abdominal pain  Dysmenorrhea    ED Discharge Orders     None          Cottie Donnice PARAS, MD 07/10/24 2330

## 2024-07-10 NOTE — ED Triage Notes (Signed)
 The pt is c/o abd pain since last Thursday she has a history of the same she is followed by an ob doctor lmp now and the  beginning of the month

## 2024-07-10 NOTE — Discharge Instructions (Signed)
 Please follow-up with your OB/GYN doctor.  Return to the ER if you have worsening pain, uncontrollable pain, persistent vomiting, or any other emergency medical concerns.

## 2024-07-21 ENCOUNTER — Encounter (HOSPITAL_BASED_OUTPATIENT_CLINIC_OR_DEPARTMENT_OTHER): Payer: Self-pay | Admitting: Emergency Medicine

## 2024-07-21 ENCOUNTER — Other Ambulatory Visit: Payer: Self-pay

## 2024-07-21 ENCOUNTER — Emergency Department (HOSPITAL_BASED_OUTPATIENT_CLINIC_OR_DEPARTMENT_OTHER)
Admission: EM | Admit: 2024-07-21 | Discharge: 2024-07-21 | Disposition: A | Discharge status: Home / Self Care | Attending: Emergency Medicine | Admitting: Emergency Medicine

## 2024-07-21 ENCOUNTER — Emergency Department (HOSPITAL_BASED_OUTPATIENT_CLINIC_OR_DEPARTMENT_OTHER): Admitting: Radiology

## 2024-07-21 DIAGNOSIS — G43809 Other migraine, not intractable, without status migrainosus: Secondary | ICD-10-CM | POA: Insufficient documentation

## 2024-07-21 DIAGNOSIS — R0789 Other chest pain: Secondary | ICD-10-CM | POA: Diagnosis not present

## 2024-07-21 DIAGNOSIS — R079 Chest pain, unspecified: Secondary | ICD-10-CM | POA: Diagnosis not present

## 2024-07-21 DIAGNOSIS — R42 Dizziness and giddiness: Secondary | ICD-10-CM | POA: Diagnosis not present

## 2024-07-21 LAB — CBC
HCT: 38.2 % (ref 36.0–46.0)
Hemoglobin: 12.9 g/dL (ref 12.0–15.0)
MCH: 28.8 pg (ref 26.0–34.0)
MCHC: 33.8 g/dL (ref 30.0–36.0)
MCV: 85.3 fL (ref 80.0–100.0)
Platelets: 307 K/uL (ref 150–400)
RBC: 4.48 MIL/uL (ref 3.87–5.11)
RDW: 15.3 % (ref 11.5–15.5)
WBC: 6.5 K/uL (ref 4.0–10.5)
nRBC: 0 % (ref 0.0–0.2)

## 2024-07-21 LAB — BASIC METABOLIC PANEL WITH GFR
Anion gap: 12 (ref 5–15)
BUN: 7 mg/dL (ref 6–20)
CO2: 23 mmol/L (ref 22–32)
Calcium: 9.5 mg/dL (ref 8.9–10.3)
Chloride: 102 mmol/L (ref 98–111)
Creatinine, Ser: 0.86 mg/dL (ref 0.44–1.00)
GFR, Estimated: >60 mL/min (ref >60)
Glucose, Bld: 77 mg/dL (ref 70–99)
Potassium: 4 mmol/L (ref 3.5–5.1)
Sodium: 136 mmol/L (ref 135–145)

## 2024-07-21 LAB — TROPONIN T, HIGH SENSITIVITY: Troponin T High Sensitivity: <15 ng/L (ref 0–19)

## 2024-07-21 LAB — HCG, SERUM, QUALITATIVE: Preg, Serum: NEGATIVE

## 2024-07-21 MED ORDER — SODIUM CHLORIDE 0.9 % IV BOLUS
1000.0000 mL | Freq: Once | INTRAVENOUS | Status: AC
  Administered 2024-07-21: 1000 mL via INTRAVENOUS

## 2024-07-21 MED ORDER — ACETAMINOPHEN 500 MG PO TABS
1000.0000 mg | ORAL_TABLET | Freq: Once | ORAL | Status: AC
  Administered 2024-07-21: 1000 mg via ORAL
  Filled 2024-07-21: qty 2

## 2024-07-21 MED ORDER — DIPHENHYDRAMINE HCL 50 MG/ML IJ SOLN
25.0000 mg | Freq: Once | INTRAMUSCULAR | Status: AC
  Administered 2024-07-21: 25 mg via INTRAVENOUS
  Filled 2024-07-21: qty 1

## 2024-07-21 MED ORDER — DROPERIDOL 2.5 MG/ML IJ SOLN
1.2500 mg | Freq: Once | INTRAMUSCULAR | Status: AC
  Administered 2024-07-21: 1.25 mg via INTRAVENOUS
  Filled 2024-07-21: qty 2

## 2024-07-21 NOTE — Discharge Instructions (Addendum)
 While you were in the emergency room, you had blood work done overall that was normal.  Please follow-up with your primary care doctor to discuss long-term management of migraine headaches.  Return to the emergency department for new or worsening headache.

## 2024-07-21 NOTE — ED Triage Notes (Addendum)
 Patient reports centralized chest pain associated with nausea and dizziness that started last night. Endorses shortness of breath. Taking toradol  and zofran  at home with no relief.

## 2024-07-21 NOTE — ED Provider Notes (Addendum)
 [facility] AT [facility] Provider Note   CSN: [id number] Arrival date & time: 07/21/24  8095     Patient presents with: Chest Pain and Dizziness   Cassandra Mann is a 43 y.o. female.   This is a 43 year old female who presents to the emergency room today for what she tells me is a migraine headache.  She told the triage team that she was here today for chest pain.  Patient reports that she is having one of her migraine type headaches, she is having photophobia.  She has been taking Toradol  and Zofran  at home without relief.  She denies fever or chills, denies trauma.  This is not the worst headache of her life, there is no sudden onset.   Chest Pain Associated symptoms: dizziness   Dizziness Associated symptoms: chest pain        Prior to Admission medications   Not on File    Allergies: Latex, Naproxen , and Tramadol     Review of Systems  Cardiovascular:  Positive for chest pain.  Neurological:  Positive for dizziness.    Updated Vital Signs BP 108/70   Pulse 86   Temp 98.6 F (37 C)   Resp 19   Ht 5' 5 (1.651 m)   Wt 90.7 kg   LMP 07/10/2024   SpO2 99%   BMI 33.28 kg/m   Physical Exam Vitals reviewed.  HENT:     Head: Normocephalic.  Cardiovascular:     Rate and Rhythm: Normal rate.     Heart sounds: Normal heart sounds.  Pulmonary:     Effort: Pulmonary effort is normal.     Breath sounds: Normal breath sounds.  Musculoskeletal:        General: Normal range of motion.  Neurological:     General: No focal deficit present.     Mental Status: She is alert.     Cranial Nerves: No cranial nerve deficit.     Comments: Normal gait     (all labs ordered are listed, but only abnormal results are displayed) Labs Reviewed  BASIC METABOLIC PANEL WITH GFR  CBC  PREGNANCY, URINE  HCG, SERUM, QUALITATIVE  TROPONIN T, HIGH SENSITIVITY    EKG: EKG Interpretation Date/Time:  Wednesday July 21 2024 19:15:56  EDT Ventricular Rate:  97 PR Interval:  142 QRS Duration:  70 QT Interval:  329 QTC Calculation: 418 R Axis:   85  Text Interpretation: Sinus rhythm Low voltage, precordial leads Confirmed by Mannie Pac [phone]) on 07/21/2024 7:48:12 PM  Radiology: DG Chest 2 View Result Date: 07/21/2024 CLINICAL DATA:  Chest pain. EXAM: CHEST - 2 VIEW COMPARISON:  June 01, 2022. FINDINGS: The heart size and mediastinal contours are within normal limits. Both lungs are clear. The visualized skeletal structures are unremarkable. IMPRESSION: No active cardiopulmonary disease. Electronically Signed   By: Lynwood Landy Raddle M.D.   On: 07/21/2024 19:45     Procedures   Medications Ordered in the ED  droperidol (INAPSINE) 2.5 MG/ML injection 1.25 mg (1.25 mg Intravenous Given 07/21/24 2001)  sodium chloride  0.9 % bolus 1,000 mL (1,000 mLs Intravenous New Bag/Given 07/21/24 2005)  diphenhydrAMINE  (BENADRYL ) injection 25 mg (25 mg Intravenous Given 07/21/24 2001)  acetaminophen  (TYLENOL ) tablet 1,000 mg (1,000 mg Oral Given 07/21/24 2001)                                    Medical  Decision Making This is a 43 year old female who is here today for report of migraine headache.  She also told triage that she was having pain in her chest.  Considered CVA, considered subarachnoid, less likely meningitis, consider ACS.  Plan-patient appears to be here more for a migraine type headache.  She does endorse intermittently having some pain in her chest, states that sometimes this happens with her migraine type headaches.  When I first entered the room, she has her hood pulled down because the light is bothering her.  She has no neurological deficits.  Has a reassuring story for headache, no red flag symptoms.  Was able to get the patient's symptoms under control with medications.  She is feeling significantly better.  Her initial EKG is nonischemic.  Her troponin is negative.  Limited risk factors for ACS.  My  independent review the patient's chest x-ray shows no pneumonia.  Reviewed the patient's most recent PCP office note.  Reviewed the patient's medications.  I considered admission for this patient, however with reassuring workup and improvement she is appropriate for discharge.  Amount and/or Complexity of Data Reviewed Labs: ordered. Radiology: ordered.  Risk OTC drugs. Prescription drug management.        Final diagnoses:  Other migraine without status migrainosus, not intractable    ED Discharge Orders     None          Mannie Fairy DASEN, DO 07/21/24 2111    Mannie Fairy T, DO 07/21/24 2225

## 2024-08-02 DIAGNOSIS — N83292 Other ovarian cyst, left side: Secondary | ICD-10-CM | POA: Diagnosis not present
